# Patient Record
Sex: Female | Born: 1980 | Race: Black or African American | Hispanic: No | Marital: Single | Smoking: Never smoker
Health system: Southern US, Community
[De-identification: ages and names within clinical notes are randomized; demographics above are authoritative.]

## PROBLEM LIST (undated history)

## (undated) DIAGNOSIS — G43909 Migraine, unspecified, not intractable, without status migrainosus: Secondary | ICD-10-CM

## (undated) DIAGNOSIS — E079 Disorder of thyroid, unspecified: Secondary | ICD-10-CM

## (undated) DIAGNOSIS — E018 Other iodine-deficiency related thyroid disorders and allied conditions: Secondary | ICD-10-CM

## (undated) DIAGNOSIS — Z9289 Personal history of other medical treatment: Secondary | ICD-10-CM

## (undated) DIAGNOSIS — D649 Anemia, unspecified: Secondary | ICD-10-CM

## (undated) DIAGNOSIS — D219 Benign neoplasm of connective and other soft tissue, unspecified: Secondary | ICD-10-CM

## (undated) HISTORY — PX: KNEE SURGERY: SHX244

---

## 2003-04-28 ENCOUNTER — Emergency Department (HOSPITAL_COMMUNITY): Admission: EM | Admit: 2003-04-28 | Discharge: 2003-04-28 | Payer: Self-pay | Admitting: Emergency Medicine

## 2003-04-28 ENCOUNTER — Encounter: Payer: Self-pay | Admitting: Emergency Medicine

## 2003-05-12 ENCOUNTER — Encounter: Payer: Self-pay | Admitting: Internal Medicine

## 2003-05-12 ENCOUNTER — Encounter: Admission: RE | Admit: 2003-05-12 | Discharge: 2003-05-12 | Payer: Self-pay | Admitting: Internal Medicine

## 2003-06-30 ENCOUNTER — Emergency Department (HOSPITAL_COMMUNITY): Admission: EM | Admit: 2003-06-30 | Discharge: 2003-06-30 | Payer: Self-pay | Admitting: Emergency Medicine

## 2003-08-28 ENCOUNTER — Emergency Department (HOSPITAL_COMMUNITY): Admission: AD | Admit: 2003-08-28 | Discharge: 2003-08-28 | Payer: Self-pay | Admitting: Family Medicine

## 2003-09-02 ENCOUNTER — Emergency Department (HOSPITAL_COMMUNITY): Admission: AD | Admit: 2003-09-02 | Discharge: 2003-09-02 | Payer: Self-pay | Admitting: Family Medicine

## 2004-11-26 ENCOUNTER — Emergency Department (HOSPITAL_COMMUNITY): Admission: AD | Admit: 2004-11-26 | Discharge: 2004-11-26 | Payer: Self-pay | Admitting: Family Medicine

## 2007-12-05 ENCOUNTER — Emergency Department (HOSPITAL_COMMUNITY): Admission: EM | Admit: 2007-12-05 | Discharge: 2007-12-05 | Payer: Self-pay | Admitting: Family Medicine

## 2007-12-22 ENCOUNTER — Emergency Department (HOSPITAL_COMMUNITY): Admission: EM | Admit: 2007-12-22 | Discharge: 2007-12-22 | Payer: Self-pay | Admitting: Family Medicine

## 2009-06-18 ENCOUNTER — Emergency Department (HOSPITAL_COMMUNITY): Admission: EM | Admit: 2009-06-18 | Discharge: 2009-06-18 | Payer: Self-pay | Admitting: Emergency Medicine

## 2010-09-26 ENCOUNTER — Encounter (HOSPITAL_COMMUNITY)
Admission: RE | Admit: 2010-09-26 | Discharge: 2010-10-14 | Payer: Self-pay | Source: Home / Self Care | Attending: Endocrinology | Admitting: Endocrinology

## 2010-10-14 ENCOUNTER — Ambulatory Visit (HOSPITAL_COMMUNITY)
Admission: RE | Admit: 2010-10-14 | Discharge: 2010-10-14 | Payer: Self-pay | Source: Home / Self Care | Attending: Endocrinology | Admitting: Endocrinology

## 2010-11-06 ENCOUNTER — Encounter: Payer: Self-pay | Admitting: Obstetrics & Gynecology

## 2010-11-06 ENCOUNTER — Encounter: Payer: Self-pay | Admitting: Endocrinology

## 2010-12-26 LAB — HCG, SERUM, QUALITATIVE: Preg, Serum: NEGATIVE

## 2011-01-20 LAB — URINALYSIS, ROUTINE W REFLEX MICROSCOPIC

## 2011-01-20 LAB — URINE MICROSCOPIC-ADD ON

## 2011-07-10 LAB — POCT URINALYSIS DIP (DEVICE)
Bilirubin Urine: NEGATIVE
Nitrite: NEGATIVE
Operator id: 247071
Specific Gravity, Urine: 1.015

## 2011-07-10 LAB — POCT PREGNANCY, URINE: Operator id: 247071

## 2013-04-15 HISTORY — PX: MYOMECTOMY: SHX85

## 2014-05-14 ENCOUNTER — Encounter (HOSPITAL_COMMUNITY): Payer: Self-pay | Admitting: Emergency Medicine

## 2014-05-14 ENCOUNTER — Emergency Department (HOSPITAL_COMMUNITY)
Admission: EM | Admit: 2014-05-14 | Discharge: 2014-05-14 | Disposition: A | Discharge status: Home / Self Care | Payer: BC Managed Care – PPO | Attending: Emergency Medicine | Admitting: Emergency Medicine

## 2014-05-14 DIAGNOSIS — N898 Other specified noninflammatory disorders of vagina: Secondary | ICD-10-CM | POA: Insufficient documentation

## 2014-05-14 DIAGNOSIS — E079 Disorder of thyroid, unspecified: Secondary | ICD-10-CM | POA: Insufficient documentation

## 2014-05-14 DIAGNOSIS — M545 Low back pain, unspecified: Secondary | ICD-10-CM | POA: Insufficient documentation

## 2014-05-14 DIAGNOSIS — Z3202 Encounter for pregnancy test, result negative: Secondary | ICD-10-CM | POA: Insufficient documentation

## 2014-05-14 DIAGNOSIS — Z791 Long term (current) use of non-steroidal anti-inflammatories (NSAID): Secondary | ICD-10-CM | POA: Insufficient documentation

## 2014-05-14 DIAGNOSIS — Z79899 Other long term (current) drug therapy: Secondary | ICD-10-CM | POA: Insufficient documentation

## 2014-05-14 DIAGNOSIS — R112 Nausea with vomiting, unspecified: Secondary | ICD-10-CM | POA: Insufficient documentation

## 2014-05-14 HISTORY — DX: Benign neoplasm of connective and other soft tissue, unspecified: D21.9

## 2014-05-14 HISTORY — DX: Disorder of thyroid, unspecified: E07.9

## 2014-05-14 LAB — LIPASE, BLOOD: LIPASE: 22 U/L (ref 11–59)

## 2014-05-14 LAB — CBC WITH DIFFERENTIAL/PLATELET
BASOS ABS: 0 10*3/uL (ref 0.0–0.1)
Basophils Relative: 0 % (ref 0–1)
EOS ABS: 0 10*3/uL (ref 0.0–0.7)
EOS PCT: 1 % (ref 0–5)
HEMATOCRIT: 31.8 % — AB (ref 36.0–46.0)
HEMOGLOBIN: 10 g/dL — AB (ref 12.0–15.0)
LYMPHS ABS: 2.3 10*3/uL (ref 0.7–4.0)
Lymphocytes Relative: 39 % (ref 12–46)
MCH: 22.7 pg — ABNORMAL LOW (ref 26.0–34.0)
MCHC: 31.4 g/dL (ref 30.0–36.0)
MCV: 72.3 fL — ABNORMAL LOW (ref 78.0–100.0)
MONO ABS: 0.3 10*3/uL (ref 0.1–1.0)
MONOS PCT: 6 % (ref 3–12)
NEUTROS ABS: 3.2 10*3/uL (ref 1.7–7.7)
Neutrophils Relative %: 54 % (ref 43–77)
PLATELETS: 277 10*3/uL (ref 150–400)
RBC: 4.4 MIL/uL (ref 3.87–5.11)
RDW: 18.3 % — ABNORMAL HIGH (ref 11.5–15.5)
WBC: 5.8 10*3/uL (ref 4.0–10.5)

## 2014-05-14 LAB — COMPREHENSIVE METABOLIC PANEL
ALT: 40 U/L — ABNORMAL HIGH (ref 0–35)
ANION GAP: 15 (ref 5–15)
AST: 40 U/L — ABNORMAL HIGH (ref 0–37)
Albumin: 3.5 g/dL (ref 3.5–5.2)
Alkaline Phosphatase: 87 U/L (ref 39–117)
BUN: 8 mg/dL (ref 6–23)
CHLORIDE: 102 meq/L (ref 96–112)
CO2: 24 meq/L (ref 19–32)
Calcium: 8.8 mg/dL (ref 8.4–10.5)
Creatinine, Ser: 0.84 mg/dL (ref 0.50–1.10)
Glucose, Bld: 74 mg/dL (ref 70–99)
POTASSIUM: 3.4 meq/L — AB (ref 3.7–5.3)
Sodium: 141 mEq/L (ref 137–147)
TOTAL PROTEIN: 7.7 g/dL (ref 6.0–8.3)
Total Bilirubin: 0.3 mg/dL (ref 0.3–1.2)

## 2014-05-14 LAB — URINALYSIS, ROUTINE W REFLEX MICROSCOPIC
BILIRUBIN URINE: NEGATIVE
Glucose, UA: NEGATIVE mg/dL
KETONES UR: 40 mg/dL — AB
NITRITE: POSITIVE — AB
PH: 5.5 (ref 5.0–8.0)
Protein, ur: NEGATIVE mg/dL
SPECIFIC GRAVITY, URINE: 1.016 (ref 1.005–1.030)
Urobilinogen, UA: 1 mg/dL (ref 0.0–1.0)

## 2014-05-14 LAB — URINE MICROSCOPIC-ADD ON

## 2014-05-14 LAB — PREGNANCY, URINE: PREG TEST UR: NEGATIVE

## 2014-05-14 MED ORDER — PANTOPRAZOLE SODIUM 40 MG PO TBEC: 40.0000 mg | DELAYED_RELEASE_TABLET | Freq: Every day | ORAL | Status: DC

## 2014-05-14 NOTE — Discharge Instructions (Signed)
Please follow up with your primary care doctor if your symptoms do not resolve. You can discuss with them about doing an ultrasound of your gallbladder as outpatient. You can try taking the protonix to see if it helps you.

## 2014-05-14 NOTE — ED Provider Notes (Signed)
I saw and evaluated the patient, reviewed the resident's note and I agree with the findings and plan.  No abdominal ttp on exam.  Could be having some biliary colic.  Labs are reassuring.  She is on her menses.  I suspect the urine is contaminated.   Follow up with pcp for outpatient ultrasound.  Dorie Rank, MD 05/14/14 2023

## 2014-05-14 NOTE — ED Provider Notes (Signed)
CSN: [id number]     Arrival date & time 05/14/14  1459 History   First MD Initiated Contact with Patient 05/14/14 1804     Chief Complaint  Patient presents with  . Back Pain     (Consider location/radiation/quality/duration/timing/severity/associated sxs/prior Treatment) HPI  33 yo female with hx of hyperthyroidism and fibroids here with back pain. Yesterday after eating lunch she had bilateral back pain on the mid-lower back that radiated towards the spine. It lasted 30 mins, was sharp and throbbing, 10/10 and it resolved spontaenously.  She had emesis with it. Didn't eat anything since then. She had similar episode again yesterday night around 3 AM with emesis. She used to have these symptoms before more frequently but it got better after she had fibroid surgery. She had her last episode on September before yesterday's episode. Her mother has similar symptoms and she was found to have gallbladder problems. Patient is currently on her period. For last few weeks she has foul smelling urine with cloudy appearance. No burning or pain with urination.  Currently has very mild pain on the back. Denies abdominal pain, diarrhea, constipation, hematochezia, chest pain, sob, headache, fever, numbness, tingling. Denies alcohol use. Denies any previous hx of pancreas or gallbladder problems.  Past Medical History  Diagnosis Date  . Fibroids   . Thyroid disease    History reviewed. No pertinent past surgical history. History reviewed. No pertinent family history. History  Substance Use Topics  . Smoking status: Never Smoker   . Smokeless tobacco: Not on file  . Alcohol Use: No   OB History   Grav Para Term Preterm Abortions TAB SAB Ect Mult Living                 Review of Systems  Constitutional: Negative for fever, chills, activity change, appetite change and fatigue.  HENT: Negative for congestion, ear pain, facial swelling, hearing loss, rhinorrhea, sore throat and tinnitus.   Eyes:  Negative for photophobia, pain, discharge, redness, itching and visual disturbance.  Respiratory: Negative for apnea, cough, choking, chest tightness, shortness of breath, wheezing and stridor.   Cardiovascular: Negative for chest pain, palpitations and leg swelling.  Gastrointestinal: Positive for nausea and vomiting. Negative for abdominal pain, diarrhea, constipation, blood in stool, abdominal distention, anal bleeding and rectal pain.  Genitourinary: Positive for vaginal bleeding. Negative for dysuria, urgency, frequency, hematuria, flank pain, decreased urine volume, vaginal discharge, difficulty urinating, genital sores, vaginal pain, menstrual problem, pelvic pain and dyspareunia.  Musculoskeletal: Positive for back pain. Negative for arthralgias, gait problem, joint swelling, myalgias, neck pain and neck stiffness.  Skin: Negative.   Allergic/Immunologic: Negative.   Neurological: Negative.   Hematological: Negative.   Psychiatric/Behavioral: Negative.       Allergies  Review of patient's allergies indicates no known allergies.  Home Medications   Prior to Admission medications   Medication Sig Start Date End Date Taking? Authorizing Provider  ibuprofen (ADVIL,MOTRIN) 800 MG tablet Take 800 mg by mouth every 8 (eight) hours as needed for moderate pain.   Yes Historical Provider, MD  levothyroxine (SYNTHROID, LEVOTHROID) 75 MCG tablet Take 75 mcg by mouth daily before breakfast.   Yes Historical Provider, MD  pantoprazole (PROTONIX) 40 MG tablet Take 1 tablet (40 mg total) by mouth daily. 05/14/14   Lakeyn Dokken, MD   BP 112/69  Pulse 88  Temp(Src) 98.2 F (36.8 C) (Oral)  Resp 13  SpO2 100%  LMP 05/12/2014 Physical Exam  Constitutional: She is oriented to person,  place, and time. She appears well-developed and well-nourished. No distress.  HENT:  Head: Normocephalic and atraumatic.  Right Ear: External ear normal.  Left Ear: External ear normal.  Nose: Nose normal.   Mouth/Throat: Oropharynx is clear and moist.  Eyes: Conjunctivae and EOM are normal. Pupils are equal, round, and reactive to light. Right eye exhibits no discharge. Left eye exhibits no discharge. No scleral icterus.  Neck: Normal range of motion and full passive range of motion without pain. Neck supple. No JVD present. No spinous process tenderness and no muscular tenderness present. No rigidity. No edema, no erythema and normal range of motion present. No thyromegaly present.  Cardiovascular: Normal rate, regular rhythm, S1 normal, S2 normal, normal heart sounds and intact distal pulses.  Exam reveals no gallop and no friction rub.   No murmur heard. Pulmonary/Chest: Effort normal and breath sounds normal. No respiratory distress. She has no wheezes. She has no rales. She exhibits no tenderness.  Abdominal: Soft. Bowel sounds are normal. She exhibits no distension and no mass. There is no tenderness. There is no rigidity, no rebound, no guarding, no CVA tenderness and negative Murphy's sign.  No tenderness anywhere other than menstrual cramps.  Musculoskeletal: Normal range of motion. She exhibits no edema and no tenderness.  No tenderness on the back with palpation. Spine non tender.  Lymphadenopathy:    She has no cervical adenopathy.  Neurological: She is alert and oriented to person, place, and time. She has normal strength and normal reflexes. No cranial nerve deficit or sensory deficit. GCS eye subscore is 4. GCS verbal subscore is 5. GCS motor subscore is 6.  Skin: No rash noted. She is not diaphoretic. No erythema. No pallor.  Psychiatric: She has a normal mood and affect. Her behavior is normal.    ED Course  Procedures (including critical care time) Labs Review Labs Reviewed  CBC WITH DIFFERENTIAL - Abnormal; Notable for the following:    Hemoglobin 10.0 (*)    HCT 31.8 (*)    MCV 72.3 (*)    MCH 22.7 (*)    RDW 18.3 (*)    All other components within normal limits   COMPREHENSIVE METABOLIC PANEL - Abnormal; Notable for the following:    Potassium 3.4 (*)    AST 40 (*)    ALT 40 (*)    All other components within normal limits  LIPASE, BLOOD  PREGNANCY, URINE  URINALYSIS, ROUTINE W REFLEX MICROSCOPIC    Imaging Review No results found.   EKG Interpretation None      Will get CBC, CMP, and UA.   CBC shows low Hgb 10.0. No baseline found. CMP normal. Lipase normal.    MDM   Final diagnoses:  Bilateral low back pain without sciatica   CBC and CMP looks normal except for anemia. She admits to having anemia diagnosed previously and has taken iron for it. She currently has no pain. Will have her follow up with her PCP if pain recurs. Suggested doing u/s of abdomen as outpatient to rule out biliary etiology of pain. Her symptom is unusual for gallbladder pain since she has no abdominal complaints.   UA is unclear since she in on her period. It's nitrite positive and TNTC RBC with bacteria. This might be contaminant from her vaginal bleed. Will not treat for UTI.  Will give her some PPI's in case it's atypical GERD related pain.   Dellia Nims, MD 05/14/14 2019

## 2014-05-14 NOTE — ED Notes (Addendum)
Presents with mid-lower back cramping associated with vomiting after eating-pain began after eating lunch yesterday. Nausea and vominting only after eating. Pain is intermittent. Eating and drinking makes pain worse. Throwing up makes pain better. Denies abdominal pain, denies diarrhea.

## 2014-05-20 ENCOUNTER — Observation Stay (HOSPITAL_COMMUNITY)
Admission: EM | Admit: 2014-05-20 | Discharge: 2014-05-21 | Disposition: A | Discharge status: Home / Self Care | Payer: BC Managed Care – PPO | Attending: General Surgery | Admitting: General Surgery

## 2014-05-20 ENCOUNTER — Observation Stay (HOSPITAL_COMMUNITY): Payer: BC Managed Care – PPO | Admitting: Anesthesiology

## 2014-05-20 ENCOUNTER — Encounter (HOSPITAL_COMMUNITY)
Admission: EM | Disposition: A | Discharge status: Home / Self Care | Payer: Self-pay | Source: Home / Self Care | Attending: Emergency Medicine

## 2014-05-20 ENCOUNTER — Encounter (HOSPITAL_COMMUNITY): Payer: Self-pay | Admitting: Emergency Medicine

## 2014-05-20 ENCOUNTER — Observation Stay (HOSPITAL_COMMUNITY): Payer: BC Managed Care – PPO

## 2014-05-20 ENCOUNTER — Emergency Department (HOSPITAL_COMMUNITY): Payer: BC Managed Care – PPO

## 2014-05-20 ENCOUNTER — Encounter (HOSPITAL_COMMUNITY): Payer: BC Managed Care – PPO | Admitting: Anesthesiology

## 2014-05-20 DIAGNOSIS — R7402 Elevation of levels of lactic acid dehydrogenase (LDH): Secondary | ICD-10-CM

## 2014-05-20 DIAGNOSIS — E890 Postprocedural hypothyroidism: Secondary | ICD-10-CM | POA: Insufficient documentation

## 2014-05-20 DIAGNOSIS — K8000 Calculus of gallbladder with acute cholecystitis without obstruction: Secondary | ICD-10-CM | POA: Diagnosis not present

## 2014-05-20 DIAGNOSIS — K8050 Calculus of bile duct without cholangitis or cholecystitis without obstruction: Secondary | ICD-10-CM

## 2014-05-20 DIAGNOSIS — R740 Nonspecific elevation of levels of transaminase and lactic acid dehydrogenase [LDH]: Secondary | ICD-10-CM

## 2014-05-20 DIAGNOSIS — R300 Dysuria: Secondary | ICD-10-CM

## 2014-05-20 DIAGNOSIS — K8010 Calculus of gallbladder with chronic cholecystitis without obstruction: Secondary | ICD-10-CM

## 2014-05-20 DIAGNOSIS — K812 Acute cholecystitis with chronic cholecystitis: Secondary | ICD-10-CM | POA: Diagnosis present

## 2014-05-20 DIAGNOSIS — K8020 Calculus of gallbladder without cholecystitis without obstruction: Secondary | ICD-10-CM

## 2014-05-20 DIAGNOSIS — R7401 Elevation of levels of liver transaminase levels: Secondary | ICD-10-CM

## 2014-05-20 DIAGNOSIS — E039 Hypothyroidism, unspecified: Secondary | ICD-10-CM | POA: Diagnosis present

## 2014-05-20 DIAGNOSIS — Z5309 Procedure and treatment not carried out because of other contraindication: Secondary | ICD-10-CM | POA: Insufficient documentation

## 2014-05-20 HISTORY — DX: Migraine, unspecified, not intractable, without status migrainosus: G43.909

## 2014-05-20 HISTORY — PX: CHOLECYSTECTOMY: SHX55

## 2014-05-20 HISTORY — DX: Other iodine-deficiency related thyroid disorders and allied conditions: E01.8

## 2014-05-20 HISTORY — DX: Personal history of other medical treatment: Z92.89

## 2014-05-20 HISTORY — DX: Anemia, unspecified: D64.9

## 2014-05-20 HISTORY — PX: LAPAROSCOPIC CHOLECYSTECTOMY: SUR755

## 2014-05-20 LAB — CBC WITH DIFFERENTIAL/PLATELET
BASOS ABS: 0 10*3/uL (ref 0.0–0.1)
BASOS PCT: 0 % (ref 0–1)
EOS ABS: 0 10*3/uL (ref 0.0–0.7)
Eosinophils Relative: 1 % (ref 0–5)
HCT: 32.1 % — ABNORMAL LOW (ref 36.0–46.0)
HEMOGLOBIN: 10.3 g/dL — AB (ref 12.0–15.0)
Lymphocytes Relative: 41 % (ref 12–46)
Lymphs Abs: 1.6 10*3/uL (ref 0.7–4.0)
MCH: 22.9 pg — AB (ref 26.0–34.0)
MCHC: 32.1 g/dL (ref 30.0–36.0)
MCV: 71.5 fL — ABNORMAL LOW (ref 78.0–100.0)
MONO ABS: 0.2 10*3/uL (ref 0.1–1.0)
MONOS PCT: 5 % (ref 3–12)
NEUTROS PCT: 53 % (ref 43–77)
Neutro Abs: 2.2 10*3/uL (ref 1.7–7.7)
PLATELETS: 289 10*3/uL (ref 150–400)
RBC: 4.49 MIL/uL (ref 3.87–5.11)
RDW: 18.3 % — ABNORMAL HIGH (ref 11.5–15.5)
WBC: 4 10*3/uL (ref 4.0–10.5)

## 2014-05-20 LAB — URINALYSIS, ROUTINE W REFLEX MICROSCOPIC
Glucose, UA: NEGATIVE mg/dL
Hgb urine dipstick: NEGATIVE
Ketones, ur: 15 mg/dL — AB
Nitrite: NEGATIVE
Protein, ur: 30 mg/dL — AB
Specific Gravity, Urine: 1.02 (ref 1.005–1.030)
Urobilinogen, UA: 1 mg/dL (ref 0.0–1.0)
pH: 7.5 (ref 5.0–8.0)

## 2014-05-20 LAB — URINE MICROSCOPIC-ADD ON

## 2014-05-20 LAB — COMPREHENSIVE METABOLIC PANEL
ALT: 496 U/L — AB (ref 0–35)
AST: 933 U/L — AB (ref 0–37)
Albumin: 3.5 g/dL (ref 3.5–5.2)
Alkaline Phosphatase: 208 U/L — ABNORMAL HIGH (ref 39–117)
Anion gap: 14 (ref 5–15)
BILIRUBIN TOTAL: 1.1 mg/dL (ref 0.3–1.2)
BUN: 6 mg/dL (ref 6–23)
CO2: 23 mEq/L (ref 19–32)
Calcium: 8.8 mg/dL (ref 8.4–10.5)
Chloride: 104 mEq/L (ref 96–112)
Creatinine, Ser: 0.88 mg/dL (ref 0.50–1.10)
GFR, EST NON AFRICAN AMERICAN: 86 mL/min — AB (ref >90)
Glucose, Bld: 107 mg/dL — ABNORMAL HIGH (ref 70–99)
POTASSIUM: 3.6 meq/L — AB (ref 3.7–5.3)
SODIUM: 141 meq/L (ref 137–147)
Total Protein: 7.7 g/dL (ref 6.0–8.3)

## 2014-05-20 LAB — LIPASE, BLOOD: Lipase: 29 U/L (ref 11–59)

## 2014-05-20 SURGERY — LAPAROSCOPIC CHOLECYSTECTOMY WITH INTRAOPERATIVE CHOLANGIOGRAM
Anesthesia: General

## 2014-05-20 MED ORDER — KCL IN DEXTROSE-NACL 20-5-0.9 MEQ/L-%-% IV SOLN
INTRAVENOUS | Status: DC
  Administered 2014-05-20 – 2014-05-21 (×2): via INTRAVENOUS
  Filled 2014-05-20 (×6): qty 1000

## 2014-05-20 MED ORDER — MIDAZOLAM HCL 5 MG/5ML IJ SOLN
INTRAMUSCULAR | Status: DC | PRN
  Administered 2014-05-20 (×2): 1 mg via INTRAVENOUS

## 2014-05-20 MED ORDER — PROPOFOL 10 MG/ML IV BOLUS
INTRAVENOUS | Status: DC | PRN
  Administered 2014-05-20: 120 mg via INTRAVENOUS

## 2014-05-20 MED ORDER — OXYCODONE HCL 5 MG PO TABS: 5.0000 mg | ORAL_TABLET | Freq: Once | ORAL | Status: DC | PRN

## 2014-05-20 MED ORDER — DEXTROSE-NACL 5-0.9 % IV SOLN: INTRAVENOUS | Status: DC

## 2014-05-20 MED ORDER — SODIUM CHLORIDE 0.9 % IV SOLN
INTRAVENOUS | Status: DC | PRN
  Administered 2014-05-20: 14:00:00

## 2014-05-20 MED ORDER — ROCURONIUM BROMIDE 50 MG/5ML IV SOLN
INTRAVENOUS | Status: AC
  Filled 2014-05-20: qty 1

## 2014-05-20 MED ORDER — MEPERIDINE HCL 25 MG/ML IJ SOLN: 6.2500 mg | INTRAMUSCULAR | Status: DC | PRN

## 2014-05-20 MED ORDER — LACTATED RINGERS IV SOLN
INTRAVENOUS | Status: DC
  Administered 2014-05-20: 12:00:00 via INTRAVENOUS

## 2014-05-20 MED ORDER — PROPOFOL 10 MG/ML IV BOLUS
INTRAVENOUS | Status: AC
  Filled 2014-05-20: qty 20

## 2014-05-20 MED ORDER — PROMETHAZINE HCL 25 MG/ML IJ SOLN
25.0000 mg | Freq: Once | INTRAMUSCULAR | Status: AC
  Administered 2014-05-20: 25 mg via INTRAVENOUS
  Filled 2014-05-20: qty 1

## 2014-05-20 MED ORDER — CIPROFLOXACIN IN D5W 400 MG/200ML IV SOLN
400.0000 mg | Freq: Two times a day (BID) | INTRAVENOUS | Status: AC
  Administered 2014-05-20: 400 mg via INTRAVENOUS
  Filled 2014-05-20: qty 200

## 2014-05-20 MED ORDER — LIDOCAINE HCL (CARDIAC) 20 MG/ML IV SOLN
INTRAVENOUS | Status: DC | PRN
  Administered 2014-05-20: 100 mg via INTRAVENOUS

## 2014-05-20 MED ORDER — HYDROMORPHONE HCL PF 1 MG/ML IJ SOLN
0.2500 mg | INTRAMUSCULAR | Status: DC | PRN
Start: 2014-05-20 — End: 2014-05-20

## 2014-05-20 MED ORDER — ONDANSETRON HCL 4 MG/2ML IJ SOLN
INTRAMUSCULAR | Status: DC | PRN
  Administered 2014-05-20: 4 mg via INTRAVENOUS

## 2014-05-20 MED ORDER — LIDOCAINE HCL (CARDIAC) 20 MG/ML IV SOLN
INTRAVENOUS | Status: AC
  Filled 2014-05-20: qty 5

## 2014-05-20 MED ORDER — MORPHINE SULFATE 2 MG/ML IJ SOLN: 2.0000 mg | INTRAMUSCULAR | Status: DC | PRN

## 2014-05-20 MED ORDER — ENOXAPARIN SODIUM 40 MG/0.4ML ~~LOC~~ SOLN
40.0000 mg | SUBCUTANEOUS | Status: DC
  Administered 2014-05-21: 40 mg via SUBCUTANEOUS
  Filled 2014-05-20 (×2): qty 0.4

## 2014-05-20 MED ORDER — LACTATED RINGERS IV SOLN
INTRAVENOUS | Status: DC | PRN
  Administered 2014-05-20 (×2): via INTRAVENOUS

## 2014-05-20 MED ORDER — FENTANYL CITRATE 0.05 MG/ML IJ SOLN
100.0000 ug | Freq: Once | INTRAMUSCULAR | Status: AC
  Administered 2014-05-20: 100 ug via INTRAVENOUS
  Filled 2014-05-20: qty 2

## 2014-05-20 MED ORDER — NEOSTIGMINE METHYLSULFATE 10 MG/10ML IV SOLN
INTRAVENOUS | Status: DC | PRN
  Administered 2014-05-20: 4 mg via INTRAVENOUS

## 2014-05-20 MED ORDER — CIPROFLOXACIN IN D5W 400 MG/200ML IV SOLN
400.0000 mg | INTRAVENOUS | Status: AC
  Administered 2014-05-20: 400 mg via INTRAVENOUS
  Filled 2014-05-20: qty 200

## 2014-05-20 MED ORDER — IBUPROFEN 800 MG PO TABS: 800.0000 mg | ORAL_TABLET | Freq: Three times a day (TID) | ORAL | Status: DC | PRN

## 2014-05-20 MED ORDER — ACETAMINOPHEN 325 MG PO TABS: 650.0000 mg | ORAL_TABLET | Freq: Four times a day (QID) | ORAL | Status: DC | PRN

## 2014-05-20 MED ORDER — DEXAMETHASONE SODIUM PHOSPHATE 4 MG/ML IJ SOLN
INTRAMUSCULAR | Status: DC | PRN
  Administered 2014-05-20: 4 mg via INTRAVENOUS

## 2014-05-20 MED ORDER — MORPHINE SULFATE 4 MG/ML IJ SOLN
4.0000 mg | Freq: Once | INTRAMUSCULAR | Status: AC
  Administered 2014-05-20: 4 mg via INTRAVENOUS
  Filled 2014-05-20: qty 1

## 2014-05-20 MED ORDER — ACETAMINOPHEN 650 MG RE SUPP: 650.0000 mg | Freq: Four times a day (QID) | RECTAL | Status: DC | PRN

## 2014-05-20 MED ORDER — FENTANYL CITRATE 0.05 MG/ML IJ SOLN
INTRAMUSCULAR | Status: DC | PRN
  Administered 2014-05-20 (×3): 50 ug via INTRAVENOUS
  Administered 2014-05-20: 100 ug via INTRAVENOUS

## 2014-05-20 MED ORDER — OXYCODONE HCL 5 MG/5ML PO SOLN: 5.0000 mg | Freq: Once | ORAL | Status: DC | PRN

## 2014-05-20 MED ORDER — ROCURONIUM BROMIDE 100 MG/10ML IV SOLN
INTRAVENOUS | Status: DC | PRN
  Administered 2014-05-20: 40 mg via INTRAVENOUS
  Administered 2014-05-20: 10 mg via INTRAVENOUS

## 2014-05-20 MED ORDER — BUPIVACAINE-EPINEPHRINE (PF) 0.25% -1:200000 IJ SOLN
INTRAMUSCULAR | Status: AC
  Filled 2014-05-20: qty 30

## 2014-05-20 MED ORDER — FENTANYL CITRATE 0.05 MG/ML IJ SOLN
INTRAMUSCULAR | Status: AC
  Filled 2014-05-20: qty 5

## 2014-05-20 MED ORDER — ONDANSETRON HCL 4 MG/2ML IJ SOLN: 4.0000 mg | Freq: Once | INTRAMUSCULAR | Status: DC | PRN

## 2014-05-20 MED ORDER — SODIUM CHLORIDE 0.9 % IR SOLN
Status: DC | PRN
  Administered 2014-05-20: 1000 mL

## 2014-05-20 MED ORDER — ENOXAPARIN SODIUM 40 MG/0.4ML ~~LOC~~ SOLN: 40.0000 mg | SUBCUTANEOUS | Status: DC

## 2014-05-20 MED ORDER — MORPHINE SULFATE 2 MG/ML IJ SOLN
2.0000 mg | INTRAMUSCULAR | Status: DC | PRN
  Administered 2014-05-20: 4 mg via INTRAVENOUS
  Administered 2014-05-20: 2 mg via INTRAVENOUS
  Filled 2014-05-20: qty 2
  Filled 2014-05-20: qty 1

## 2014-05-20 MED ORDER — BUPIVACAINE-EPINEPHRINE 0.25% -1:200000 IJ SOLN
INTRAMUSCULAR | Status: DC | PRN
  Administered 2014-05-20: 13 mL

## 2014-05-20 MED ORDER — NEOSTIGMINE METHYLSULFATE 10 MG/10ML IV SOLN
INTRAVENOUS | Status: AC
  Filled 2014-05-20: qty 1

## 2014-05-20 MED ORDER — LEVOTHYROXINE SODIUM 75 MCG PO TABS
75.0000 ug | ORAL_TABLET | Freq: Every day | ORAL | Status: DC
  Administered 2014-05-21: 75 ug via ORAL
  Filled 2014-05-20 (×2): qty 1

## 2014-05-20 MED ORDER — MIDAZOLAM HCL 2 MG/2ML IJ SOLN
INTRAMUSCULAR | Status: AC
  Filled 2014-05-20: qty 2

## 2014-05-20 MED ORDER — ONDANSETRON HCL 4 MG/2ML IJ SOLN
4.0000 mg | Freq: Once | INTRAMUSCULAR | Status: AC
  Administered 2014-05-20: 4 mg via INTRAVENOUS
  Filled 2014-05-20: qty 2

## 2014-05-20 MED ORDER — ONDANSETRON HCL 4 MG/2ML IJ SOLN
4.0000 mg | Freq: Once | INTRAMUSCULAR | Status: AC
  Administered 2014-05-20: 4 mg via INTRAVENOUS

## 2014-05-20 MED ORDER — 0.9 % SODIUM CHLORIDE (POUR BTL) OPTIME
TOPICAL | Status: DC | PRN
  Administered 2014-05-20: 1000 mL

## 2014-05-20 MED ORDER — GLYCOPYRROLATE 0.2 MG/ML IJ SOLN
INTRAMUSCULAR | Status: DC | PRN
  Administered 2014-05-20: 0.6 mg via INTRAVENOUS

## 2014-05-20 MED ORDER — GLYCOPYRROLATE 0.2 MG/ML IJ SOLN
INTRAMUSCULAR | Status: AC
  Filled 2014-05-20: qty 3

## 2014-05-20 MED ORDER — DEXAMETHASONE SODIUM PHOSPHATE 4 MG/ML IJ SOLN
INTRAMUSCULAR | Status: AC
  Filled 2014-05-20: qty 1

## 2014-05-20 MED ORDER — ONDANSETRON HCL 4 MG/2ML IJ SOLN: 4.0000 mg | Freq: Four times a day (QID) | INTRAMUSCULAR | Status: DC | PRN

## 2014-05-20 MED ORDER — HYDROCODONE-ACETAMINOPHEN 5-325 MG PO TABS
1.0000 | ORAL_TABLET | ORAL | Status: DC | PRN
  Administered 2014-05-21: 1 via ORAL
  Administered 2014-05-21: 2 via ORAL
  Filled 2014-05-20: qty 2
  Filled 2014-05-20: qty 1

## 2014-05-20 SURGICAL SUPPLY — 46 items
APPLIER CLIP 5 13 M/L LIGAMAX5 (MISCELLANEOUS) ×2
APPLIER CLIP ROT 10 11.4 M/L (STAPLE) ×2
BANDAGE ADH SHEER 1  50/CT (GAUZE/BANDAGES/DRESSINGS) ×6 IMPLANT
BENZOIN TINCTURE PRP APPL 2/3 (GAUZE/BANDAGES/DRESSINGS) ×2 IMPLANT
BLADE SURG ROTATE 9660 (MISCELLANEOUS) IMPLANT
CANISTER SUCTION 2500CC (MISCELLANEOUS) ×2 IMPLANT
CHLORAPREP W/TINT 26ML (MISCELLANEOUS) ×2 IMPLANT
CLIP APPLIE 5 13 M/L LIGAMAX5 (MISCELLANEOUS) ×1 IMPLANT
CLIP APPLIE ROT 10 11.4 M/L (STAPLE) ×1 IMPLANT
COVER MAYO STAND STRL (DRAPES) ×2 IMPLANT
COVER SURGICAL LIGHT HANDLE (MISCELLANEOUS) ×2 IMPLANT
DECANTER SPIKE VIAL GLASS SM (MISCELLANEOUS) ×2 IMPLANT
DRAPE C-ARM 42X72 X-RAY (DRAPES) ×2 IMPLANT
DRAPE UTILITY 15X26 W/TAPE STR (DRAPE) ×4 IMPLANT
DRSG TEGADERM 4X4.75 (GAUZE/BANDAGES/DRESSINGS) ×2 IMPLANT
ELECT REM PT RETURN 9FT ADLT (ELECTROSURGICAL) ×2
ELECTRODE REM PT RTRN 9FT ADLT (ELECTROSURGICAL) ×1 IMPLANT
ENDOLOOP SUT PDS II  0 18 (SUTURE) ×1
ENDOLOOP SUT PDS II 0 18 (SUTURE) ×1 IMPLANT
GAUZE SPONGE 2X2 8PLY STRL LF (GAUZE/BANDAGES/DRESSINGS) ×1 IMPLANT
GLOVE BIOGEL M STRL SZ7.5 (GLOVE) ×2 IMPLANT
GLOVE BIOGEL PI IND STRL 8 (GLOVE) ×1 IMPLANT
GLOVE BIOGEL PI INDICATOR 8 (GLOVE) ×1
GOWN STRL REUS W/ TWL LRG LVL3 (GOWN DISPOSABLE) ×3 IMPLANT
GOWN STRL REUS W/ TWL XL LVL3 (GOWN DISPOSABLE) ×1 IMPLANT
GOWN STRL REUS W/TWL LRG LVL3 (GOWN DISPOSABLE) ×3
GOWN STRL REUS W/TWL XL LVL3 (GOWN DISPOSABLE) ×1
KIT BASIN OR (CUSTOM PROCEDURE TRAY) ×2 IMPLANT
KIT ROOM TURNOVER OR (KITS) ×2 IMPLANT
NS IRRIG 1000ML POUR BTL (IV SOLUTION) ×2 IMPLANT
PAD ARMBOARD 7.5X6 YLW CONV (MISCELLANEOUS) ×2 IMPLANT
POUCH SPECIMEN RETRIEVAL 10MM (ENDOMECHANICALS) ×2 IMPLANT
SCISSORS LAP 5X35 DISP (ENDOMECHANICALS) ×2 IMPLANT
SET CHOLANGIOGRAPH 5 50 .035 (SET/KITS/TRAYS/PACK) ×2 IMPLANT
SET IRRIG TUBING LAPAROSCOPIC (IRRIGATION / IRRIGATOR) ×2 IMPLANT
SLEEVE ENDOPATH XCEL 5M (ENDOMECHANICALS) ×4 IMPLANT
SPECIMEN JAR SMALL (MISCELLANEOUS) ×2 IMPLANT
SPONGE GAUZE 2X2 STER 10/PKG (GAUZE/BANDAGES/DRESSINGS) ×1
STRIP CLOSURE SKIN 1/2X4 (GAUZE/BANDAGES/DRESSINGS) ×2 IMPLANT
SUT MNCRL AB 4-0 PS2 18 (SUTURE) ×2 IMPLANT
TOWEL OR 17X24 6PK STRL BLUE (TOWEL DISPOSABLE) ×2 IMPLANT
TOWEL OR 17X26 10 PK STRL BLUE (TOWEL DISPOSABLE) ×2 IMPLANT
TRAY LAPAROSCOPIC (CUSTOM PROCEDURE TRAY) ×2 IMPLANT
TROCAR XCEL 12X100 BLDLESS (ENDOMECHANICALS) ×2 IMPLANT
TROCAR XCEL BLUNT TIP 100MML (ENDOMECHANICALS) ×2 IMPLANT
TROCAR XCEL NON-BLD 5MMX100MML (ENDOMECHANICALS) ×2 IMPLANT

## 2014-05-20 NOTE — Brief Op Note (Signed)
05/20/2014  2:47 PM  PATIENT:  [name]  34 y.o. female  PRE-OPERATIVE DIAGNOSIS:  SYMPTOMATIC CHOLELITHIASIS  POST-OPERATIVE DIAGNOSIS:  Acute on chronic cholecystitis with calculous   PROCEDURE:  Procedure(s): LAPAROSCOPIC CHOLECYSTECTOMY WITH ATTEMPTED INTRAOPERATIVE CHOLANGIOGRAM (N/A)  SURGEON:  Surgeon(s) and Role:    * Gayland Curry, MD - Primary    * Merrie Roof, MD - Assisting  PHYSICIAN ASSISTANT: NONE   ASSISTANTS: Dr Marlou Starks   ANESTHESIA:   general  EBL:  Total I/O In: 1000 [I.V.:1000] Out: -   BLOOD ADMINISTERED:none  DRAINS: none   LOCAL MEDICATIONS USED:  MARCAINE     SPECIMEN:  Source of Specimen:  gallbladder  DISPOSITION OF SPECIMEN:  PATHOLOGY  COUNTS:  YES  TOURNIQUET:  * No tourniquets in log *  DICTATION: .Other Dictation: Dictation Number [id number]  PLAN OF CARE: Admit to inpatient   PATIENT DISPOSITION:  PACU - hemodynamically stable.   Delay start of Pharmacological VTE agent (>24hrs) due to surgical blood loss or risk of bleeding: no  Leighton Ruff. Redmond Pulling, MD, FACS General, Bariatric, & Minimally Invasive Surgery [facility], [place]

## 2014-05-20 NOTE — H&P (Signed)
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  Duration of symptoms is 1 week although she reports symptoms about 2x per year after meals since  2010.  Time pattern is intermittent.  Moderate in severity.  The symptoms start after she eats.  Associated with nausea, vomiting and diarrhea.  Yesterday, she ate subway and developed pain once again.  Her symptoms persisted overnight.  She was given morphine and fentanyl in the ED and her pain is now 2-3 out of 10.  She denies modifying factors at home.  No alleviating factors.  Last oral intake was 3AM this morning.  She denies fever, chills or sweats.  Denies melena or hematochezia.  Denies a cardiac history and otherwise healthy.  Her AST/ALT have increased to 496/933, bilirubin is normal.  Normal white count.  [place] of abdomen positive for stones, normal CBD, no gallbladder wall thickening or pericholecystic fluid.    Past Medical History  Diagnosis Date  . Fibroids   . Thyroid disease   . Iodine hypothyroidism     Past Surgical History  Procedure Laterality Date  . Myomectomy  july 2014    Family History  Problem Relation Age of Onset  . Lupus Mother   . Diabetes Father    Social History:  reports that she has never smoked. She does not have any smokeless tobacco history on file. She reports that she does not drink alcohol or use illicit drugs.  Allergies: No Known Allergies   (Not in a hospital admission)  Results for orders placed during the hospital encounter of 05/20/14 (from the past 48 hour(s))  CBC WITH DIFFERENTIAL     Status: Abnormal   Collection Time    05/20/14  5:37 AM      Result Value Ref Range   WBC 4.0  4.0 - 10.5 K/uL   RBC 4.49  3.87 - 5.11 MIL/uL   Hemoglobin 10.3 (*) 12.0 - 15.0 g/dL   HCT 32.1 (*) 36.0 - 46.0 %   MCV 71.5 (*) 78.0 - 100.0 fL   MCH 22.9 (*) 26.0 - 34.0 pg   MCHC 32.1  30.0 - 36.0 g/dL   RDW 18.3 (*) 11.5 - 15.5 %   Platelets 289  150 - 400 K/uL   Neutrophils Relative % 53  43 - 77 %   Neutro Abs 2.2  1.7 - 7.7 K/uL   Lymphocytes Relative 41  12 - 46 %   Lymphs Abs 1.6  0.7 - 4.0 K/uL   Monocytes Relative 5  3 - 12 %   Monocytes Absolute 0.2  0.1 - 1.0 K/uL   Eosinophils Relative 1  0 - 5 %   Eosinophils Absolute 0.0  0.0 - 0.7 K/uL   Basophils Relative 0  0 - 1 %   Basophils Absolute 0.0  0.0 - 0.1 K/uL  COMPREHENSIVE METABOLIC PANEL     Status: Abnormal   Collection Time    05/20/14  5:37 AM      Result Value Ref Range   Sodium 141  137 - 147 mEq/L   Potassium 3.6 (*) 3.7 - 5.3 mEq/L   Chloride 104  96 - 112 mEq/L   CO2 23  19 - 32 mEq/L   Glucose, Bld 107 (*) 70 - 99 mg/dL  BUN 6  6 - 23 mg/dL   Creatinine, Ser 0.88  0.50 - 1.10 mg/dL   Calcium 8.8  8.4 - 10.5 mg/dL   Total Protein 7.7  6.0 - 8.3 g/dL   Albumin 3.5  3.5 - 5.2 g/dL   AST 933 (*) 0 - 37 U/L   ALT 496 (*) 0 - 35 U/L   Alkaline Phosphatase 208 (*) 39 - 117 U/L   Total Bilirubin 1.1  0.3 - 1.2 mg/dL   GFR calc non Af Amer 86 (*) >90 mL/min   GFR calc Af Amer >90  >90 mL/min   Comment: (NOTE)     The eGFR has been calculated using the CKD EPI equation.     This calculation has not been validated in all clinical situations.     eGFR's persistently <90 mL/min signify possible Chronic Kidney     Disease.   Anion gap 14  5 - 15  LIPASE, BLOOD     Status: None   Collection Time    05/20/14  5:37 AM      Result Value Ref Range   Lipase 29  11 - 59 U/L  URINALYSIS, ROUTINE W REFLEX MICROSCOPIC     Status: Abnormal   Collection Time    05/20/14  5:45 AM      Result Value Ref Range   Color, Urine AMBER (*) YELLOW   Comment: BIOCHEMICALS MAY BE AFFECTED BY COLOR   APPearance CLOUDY (*) CLEAR   Specific Gravity, Urine 1.020  1.005 - 1.030   pH 7.5  5.0 - 8.0   Glucose, UA  NEGATIVE  NEGATIVE mg/dL   Hgb urine dipstick NEGATIVE  NEGATIVE   Bilirubin Urine SMALL (*) NEGATIVE   Ketones, ur 15 (*) NEGATIVE mg/dL   Protein, ur 30 (*) NEGATIVE mg/dL   Urobilinogen, UA 1.0  0.0 - 1.0 mg/dL   Nitrite NEGATIVE  NEGATIVE   Leukocytes, UA SMALL (*) NEGATIVE  URINE MICROSCOPIC-ADD ON     Status: Abnormal   Collection Time    05/20/14  5:45 AM      Result Value Ref Range   Squamous Epithelial / LPF FEW (*) RARE   WBC, UA 3-6  <3 WBC/hpf   RBC / HPF 0-2  <3 RBC/hpf   Bacteria, UA FEW (*) RARE   Urine-Other MUCOUS PRESENT     US Abdomen Complete  05/20/2014   CLINICAL DATA:  Abdominal pain  EXAM: ULTRASOUND ABDOMEN COMPLETE  COMPARISON:  None.  FINDINGS: Gallbladder:  Within the gallbladder, there are multiple echogenic foci which move and shadow consistent with gallstones. Largest individual gallstone measures 1.0 cm in length. There is no gallbladder wall thickening or pericholecystic fluid. No sonographic Murphy sign noted.  Common bile duct:  Diameter: 5 mm. There is no intrahepatic, common hepatic, or common bile duct dilatation.  Liver:  No focal lesion identified. Within normal limits in parenchymal echogenicity.  IVC:  No abnormality visualized.  Pancreas:  Visualized portion unremarkable. Portions of the pancreas are obscured by gas, primarily in the tail region.  Spleen:  Size and appearance within normal limits.  Right Kidney:  Length: 10.9 cm. Echogenicity within normal limits. No mass or hydronephrosis visualized.  Left Kidney:  Length: 11.2 cm. Echogenicity within normal limits. No mass or hydronephrosis visualized.  Abdominal aorta:  No aneurysm visualized.  Other findings:  No demonstrable ascites.  IMPRESSION: Cholelithiasis. Portions of the pancreas are obscured by gas. Visualized portions of pancreas appear normal. Study otherwise  unremarkable.   Electronically Signed   By: Lowella Grip M.D.   On: 05/20/2014 07:37    Review of Systems  Constitutional:  Negative.   HENT: Negative.   Eyes: Negative.   Respiratory: Negative.   Cardiovascular: Negative.   Gastrointestinal: Positive for nausea, vomiting, abdominal pain and diarrhea. Negative for heartburn, blood in stool and melena.  Genitourinary: Positive for dysuria, frequency and flank pain. Negative for urgency and hematuria.  Musculoskeletal: Negative.   Neurological: Negative.   Endo/Heme/Allergies: Negative.   Psychiatric/Behavioral: Negative.     Blood pressure 133/76, pulse 55, temperature 97.9 F (36.6 C), temperature source Oral, resp. rate 16, height _0  (1.702 m), weight 240 lb (108.863 kg), last menstrual period 05/12/2014, SpO2 100.00%. Physical Exam  Constitutional: She is oriented to person, place, and time. She appears well-developed and well-nourished. No distress.  HENT:  Head: Normocephalic.  Neck: Neck supple. No thyromegaly present.  Cardiovascular: Normal rate, regular rhythm, normal heart sounds and intact distal pulses.  Exam reveals no gallop and no friction rub.   No murmur heard. Respiratory: Effort normal and breath sounds normal. No respiratory distress. She has no wheezes. She has no rales. She exhibits no tenderness.  GI: Soft. Bowel sounds are normal. She exhibits no distension and no mass. There is no rebound and no guarding.  No CVA tenderness.  Negative Murphy's sign.  She exhibits minimal tenderness to RUQ  Musculoskeletal: Normal range of motion. She exhibits no tenderness.  Lymphadenopathy:    She has no cervical adenopathy.  Neurological: She is alert and oriented to person, place, and time.  Skin: Skin is dry. No rash noted. She is not diaphoretic. No erythema. No pallor.  Psychiatric: She has a normal mood and affect. Her behavior is normal. Judgment and thought content normal.     Assessment/Plan Symptomatic cholelithiasis Transaminitis, possibly passed a CBD stone -keep NPO -proceed with a laparoscopic cholecystectomy with IOC later  today -obtain consent -Cipro on call to OR -IVF -pain control -repeat LFTs in AM Dysuria -negative nitrates, few WBCs, obtain urine culture and follow Hypothyroidism C/w home meds  Kyion Gautier, [facility] ANP-BC Pager [phone] 05/20/2014, 11:24 AM

## 2014-05-20 NOTE — ED Notes (Signed)
[place] at bedside per [place]

## 2014-05-20 NOTE — ED Notes (Signed)
ciproflaxocin and dextrose 5% and 0.9% nacl with KCl 20 mEq/L infusion not available before patient's departure to OR

## 2014-05-20 NOTE — ED Notes (Signed)
Dr Miller at bedside. 

## 2014-05-20 NOTE — Anesthesia Preprocedure Evaluation (Addendum)
Anesthesia Evaluation  Patient identified by MRN, date of birth, ID band Patient awake    Reviewed: Allergy & Precautions, H&P , NPO status , Patient's Chart, lab work & pertinent test results  Airway Mallampati: I TM Distance: >3 FB Neck ROM: Full    Dental   Pulmonary          Cardiovascular     Neuro/Psych    GI/Hepatic   Endo/Other  Hypothyroidism   Renal/GU      Musculoskeletal   Abdominal   Peds  Hematology   Anesthesia Other Findings   Reproductive/Obstetrics                          Anesthesia Physical Anesthesia Plan  ASA: II  Anesthesia Plan: General   Post-op Pain Management:    Induction: Intravenous  Airway Management Planned: Oral ETT  Additional Equipment:   Intra-op Plan:   Post-operative Plan: Extubation in OR  Informed Consent: I have reviewed the patients History and Physical, chart, labs and discussed the procedure including the risks, benefits and alternatives for the proposed anesthesia with the patient or authorized representative who has indicated his/her understanding and acceptance.   Dental advisory given  Plan Discussed with: Surgeon and CRNA  Anesthesia Plan Comments:        Anesthesia Quick Evaluation

## 2014-05-20 NOTE — ED Notes (Signed)
Obtained procedural consent

## 2014-05-20 NOTE — Anesthesia Procedure Notes (Signed)
Procedure Name: Intubation Date/Time: 05/20/2014 1:10 PM Performed by: Susa Loffler Pre-anesthesia Checklist: Patient identified, Timeout performed, Emergency Drugs available, Suction available and Patient being monitored Patient Re-evaluated:Patient Re-evaluated prior to inductionOxygen Delivery Method: Circle system utilized Preoxygenation: Pre-oxygenation with 100% oxygen Intubation Type: IV induction Ventilation: Mask ventilation without difficulty Laryngoscope Size: Mac and 3 Grade View: Grade II Tube type: Oral Tube size: 7.5 mm Number of attempts: 1 Airway Equipment and Method: Stylet Placement Confirmation: ETT inserted through vocal cords under direct vision,  positive ETCO2 and breath sounds checked- equal and bilateral Secured at: 21 cm Tube secured with: Tape Dental Injury: Teeth and Oropharynx as per pre-operative assessment

## 2014-05-20 NOTE — ED Notes (Signed)
Pt to ED c/o upper abdominal cramping since Sunday. Also c/o back pain; recently seen in ED on 7/30 for same

## 2014-05-20 NOTE — Transfer of Care (Signed)
Immediate Anesthesia Transfer of Care Note  Patient: [name]  Procedure(s) Performed: Procedure(s): LAPAROSCOPIC CHOLECYSTECTOMY WITH INTRAOPERATIVE CHOLANGIOGRAM (N/A)  Patient Location: PACU  Anesthesia Type:General  Level of Consciousness: awake, oriented and patient cooperative  Airway & Oxygen Therapy: Patient Spontanous Breathing and Patient connected to nasal cannula oxygen  Post-op Assessment: Report given to PACU RN and Post -op Vital signs reviewed and stable  Post vital signs: Reviewed  Complications: No apparent anesthesia complications

## 2014-05-20 NOTE — ED Provider Notes (Signed)
9:54 AM Care assumed from Dr. Sabra Heck.  Pt feels better now.  US shows gallstones.  Surgery consult pending.   Taken to OR.  Clinical Impression: 1. Biliary colic   2. Calculus of gallbladder without cholecystitis without obstruction       Marissa Siren III, MD 05/20/14 1520

## 2014-05-20 NOTE — Anesthesia Postprocedure Evaluation (Signed)
Anesthesia Post Note  Patient: [name]  Procedure(s) Performed: Procedure(s) (LRB): LAPAROSCOPIC CHOLECYSTECTOMY WITH INTRAOPERATIVE CHOLANGIOGRAM (N/A)  Anesthesia type: general  Patient location: PACU  Post pain: Pain level controlled  Post assessment: Patient's Cardiovascular Status Stable  Last Vitals:  Filed Vitals:   05/20/14 1445  BP: 106/66  Pulse: 58  Temp: 36.5 C  Resp: 12    Post vital signs: Reviewed and stable  Level of consciousness: sedated  Complications: No apparent anesthesia complications

## 2014-05-20 NOTE — ED Provider Notes (Signed)
CSN: [id number]     Arrival date & time 05/20/14  0522 History   First MD Initiated Contact with Patient 05/20/14 [phone]     Chief Complaint  Patient presents with  . Back Pain  . Abdominal Pain     (Consider location/radiation/quality/duration/timing/severity/associated sxs/prior Treatment) HPI Comments: 33 year old female overweight, history of fibroids status post resection.   Complains of abdominal pain in the epigastrium, right and left upper quadrant radiating to the back and associated with multiple episodes of vomiting. She has had very little to eat and she states that this can make the pain worse sometimes. She denies hematuria or diarrhea but has had some dysuria for several months. There has been no fevers or chills. The symptoms are persistent over the last 5 days though they wax and wane throughout the day and worsened by eating. She still has her gallbladder, still has appendix. Denies history of pancreatitis or stomach ulcer and does not use aspirin-containing medications. Occasionally uses ibuprofen.   Patient is a 33 y.o. female presenting with back pain and abdominal pain. The history is provided by the patient.  Back Pain Associated symptoms: abdominal pain   Abdominal Pain   Past Medical History  Diagnosis Date  . Fibroids   . Thyroid disease   . Iodine hypothyroidism    Past Surgical History  Procedure Laterality Date  . Myomectomy  july 2014   Family History  Problem Relation Age of Onset  . Lupus Mother   . Diabetes Father    History  Substance Use Topics  . Smoking status: Never Smoker   . Smokeless tobacco: Not on file  . Alcohol Use: No   OB History   Grav Para Term Preterm Abortions TAB SAB Ect Mult Living                 Review of Systems  Gastrointestinal: Positive for abdominal pain.  Musculoskeletal: Positive for back pain.  All other systems reviewed and are negative.     Allergies  Review of patient's allergies indicates no known  allergies.  Home Medications   Prior to Admission medications   Medication Sig Start Date End Date Taking? Authorizing Provider  ibuprofen (ADVIL,MOTRIN) 800 MG tablet Take 800 mg by mouth every 8 (eight) hours as needed for moderate pain.   Yes Historical Provider, MD  levothyroxine (SYNTHROID, LEVOTHROID) 75 MCG tablet Take 75 mcg by mouth daily before breakfast.   Yes Historical Provider, MD  pantoprazole (PROTONIX) 40 MG tablet Take 1 tablet (40 mg total) by mouth daily. 05/14/14   Tasrif Ahmed, MD   BP 130/77  Pulse 71  Temp(Src) 98 F (36.7 C) (Oral)  Resp 16  Ht 5\' 7"  (1.702 m)  Wt 240 lb (108.863 kg)  BMI 37.58 kg/m2  SpO2 100%  LMP 05/12/2014 Physical Exam  Nursing note and vitals reviewed. Constitutional: She appears well-developed and well-nourished. No distress.  HENT:  Head: Normocephalic and atraumatic.  Mouth/Throat: Oropharynx is clear and moist. No oropharyngeal exudate.  Eyes: Conjunctivae and EOM are normal. Pupils are equal, round, and reactive to light. Right eye exhibits no discharge. Left eye exhibits no discharge. No scleral icterus.  Neck: Normal range of motion. Neck supple. No JVD present. No thyromegaly present.  Cardiovascular: Normal rate, regular rhythm, normal heart sounds and intact distal pulses.  Exam reveals no gallop and no friction rub.   No murmur heard. Pulmonary/Chest: Effort normal and breath sounds normal. No respiratory distress. She has no wheezes. She has  no rales.  Abdominal: Soft. Bowel sounds are normal. She exhibits no distension and no mass. There is tenderness ( Mild tenderness to palpation in the epigastrium, right upper and left upper quadrant. No Murphy's sign, no lower abdominal tenderness, very soft).  Musculoskeletal: Normal range of motion. She exhibits no edema and no tenderness.  Lymphadenopathy:    She has no cervical adenopathy.  Neurological: She is alert. Coordination normal.  Skin: Skin is warm and dry. No rash  noted. No erythema.  Psychiatric: She has a normal mood and affect. Her behavior is normal.    ED Course  Procedures (including critical care time) Labs Review Labs Reviewed  CBC WITH DIFFERENTIAL - Abnormal; Notable for the following:    Hemoglobin 10.3 (*)    HCT 32.1 (*)    MCV 71.5 (*)    MCH 22.9 (*)    RDW 18.3 (*)    All other components within normal limits  COMPREHENSIVE METABOLIC PANEL - Abnormal; Notable for the following:    Potassium 3.6 (*)    Glucose, Bld 107 (*)    AST 933 (*)    ALT 496 (*)    Alkaline Phosphatase 208 (*)    GFR calc non Af Amer 86 (*)    All other components within normal limits  URINALYSIS, ROUTINE W REFLEX MICROSCOPIC - Abnormal; Notable for the following:    Color, Urine AMBER (*)    APPearance CLOUDY (*)    Bilirubin Urine SMALL (*)    Ketones, ur 15 (*)    Protein, ur 30 (*)    Leukocytes, UA SMALL (*)    All other components within normal limits  URINE MICROSCOPIC-ADD ON - Abnormal; Notable for the following:    Squamous Epithelial / LPF FEW (*)    Bacteria, UA FEW (*)    All other components within normal limits  URINE CULTURE  LIPASE, BLOOD  HEPATIC FUNCTION PANEL  BASIC METABOLIC PANEL  CBC    Imaging Review Dg Cholangiogram Operative  05/20/2014   CLINICAL DATA:  Cholelithiasis.  EXAM: INTRAOPERATIVE CHOLANGIOGRAM  TECHNIQUE: Cholangiographic images from the C-arm fluoroscopic device were submitted for interpretation post-operatively. Please see the procedural report for the amount of contrast and the fluoroscopy time utilized.  COMPARISON:  Ultrasound 05/20/2014  FINDINGS: There is no contrast in the biliary system. The contrast was extravasating and accumulating along the liver margin.  IMPRESSION: Non visualization of the biliary system.   Electronically Signed   By: Markus Daft M.D.   On: 05/20/2014 14:55   US Abdomen Complete  05/20/2014   CLINICAL DATA:  Abdominal pain  EXAM: ULTRASOUND ABDOMEN COMPLETE  COMPARISON:  None.   FINDINGS: Gallbladder:  Within the gallbladder, there are multiple echogenic foci which move and shadow consistent with gallstones. Largest individual gallstone measures 1.0 cm in length. There is no gallbladder wall thickening or pericholecystic fluid. No sonographic Murphy sign noted.  Common bile duct:  Diameter: 5 mm. There is no intrahepatic, common hepatic, or common bile duct dilatation.  Liver:  No focal lesion identified. Within normal limits in parenchymal echogenicity.  IVC:  No abnormality visualized.  Pancreas:  Visualized portion unremarkable. Portions of the pancreas are obscured by gas, primarily in the tail region.  Spleen:  Size and appearance within normal limits.  Right Kidney:  Length: 10.9 cm. Echogenicity within normal limits. No mass or hydronephrosis visualized.  Left Kidney:  Length: 11.2 cm. Echogenicity within normal limits. No mass or hydronephrosis visualized.  Abdominal aorta:  No aneurysm visualized.  Other findings:  No demonstrable ascites.  IMPRESSION: Cholelithiasis. Portions of the pancreas are obscured by gas. Visualized portions of pancreas appear normal. Study otherwise unremarkable.   Electronically Signed   By: Lowella Grip M.D.   On: 05/20/2014 07:37     EKG Interpretation None      MDM   Final diagnoses:  Biliary colic  Calculus of gallbladder without cholecystitis without obstruction    The patient has normal vital signs, there is some concern that this could be cholecystitis, pancreatitis or transaminitis. Would also consider stomach ulcer. Start with labs, symptomatic medications, patient declines pain medication but requests antiemetic at this time.  LFT's up - pt has sx of cholecystitis, [place] read pending, change of shift - care signed out to oncoming EDP  Johnna Acosta, MD 05/20/14 2050

## 2014-05-20 NOTE — ED Notes (Signed)
Pt c/o RUQ pain x 2 days; reports hx of intermittent abd pain x 5 years. Pain described as a "stomach ache" with cramping. Pt reports taking ibuprofen at home without relief.  Denies dysuria; reports strong odor and cloudiness x 1 week.  Also has been told in the past that gallbladder was inflamed

## 2014-05-20 NOTE — H&P (Signed)
I saw the patient, participated in the history, exam and medical decision making, and concur with the physician assistant's note above.  Pt seen and examined. History as above. Postprandial rt abd and shoulder pain. +GS. Believe elevated LFTs are due to passage of gallstone. No etoh. No risk factors for hepatitis. Hasn't been taking tylenol  I believe the patient's symptoms are consistent with gallbladder disease.  We discussed gallbladder disease. The patient was given Neurosurgeon. We discussed non-operative and operative management. We discussed the signs & symptoms of acute cholecystitis  I discussed laparoscopic cholecystectomy with IOC in detail.  The patient was shown diagrams detailing the procedure.  We discussed the risks and benefits of a laparoscopic cholecystectomy including, but not limited to bleeding, infection, injury to surrounding structures such as the intestine or liver, bile leak, retained gallstones, need to convert to an open procedure, prolonged diarrhea, blood clots such as  DVT, common bile duct injury, anesthesia risks, and possible need for additional procedures.  We discussed the typical post-operative recovery course. I explained that the likelihood of improvement of their symptoms is good. Discussed possibility of postop ERCP  Leighton Ruff. Redmond Pulling, MD, FACS General, Bariatric, & Minimally Invasive Surgery [facility], [place]

## 2014-05-20 NOTE — ED Notes (Signed)
Report called to OR. Pt will be going to [facility]

## 2014-05-21 ENCOUNTER — Encounter (HOSPITAL_COMMUNITY): Payer: Self-pay | Admitting: General Surgery

## 2014-05-21 LAB — CBC
HEMATOCRIT: 31.4 % — AB (ref 36.0–46.0)
HEMOGLOBIN: 9.9 g/dL — AB (ref 12.0–15.0)
MCH: 23 pg — ABNORMAL LOW (ref 26.0–34.0)
MCHC: 31.5 g/dL (ref 30.0–36.0)
MCV: 72.9 fL — ABNORMAL LOW (ref 78.0–100.0)
Platelets: 263 10*3/uL (ref 150–400)
RBC: 4.31 MIL/uL (ref 3.87–5.11)
RDW: 18.8 % — ABNORMAL HIGH (ref 11.5–15.5)
WBC: 6.3 10*3/uL (ref 4.0–10.5)

## 2014-05-21 LAB — HEPATIC FUNCTION PANEL
ALBUMIN: 2.9 g/dL — AB (ref 3.5–5.2)
ALBUMIN: 2.9 g/dL — AB (ref 3.5–5.2)
ALK PHOS: 193 U/L — AB (ref 39–117)
ALK PHOS: 199 U/L — AB (ref 39–117)
ALT: 598 U/L — AB (ref 0–35)
ALT: 576 U/L — ABNORMAL HIGH (ref 0–35)
AST: 556 U/L — ABNORMAL HIGH (ref 0–37)
AST: 429 U/L — AB (ref 0–37)
BILIRUBIN TOTAL: 1.3 mg/dL — AB (ref 0.3–1.2)
Bilirubin, Direct: 0.5 mg/dL — ABNORMAL HIGH (ref 0.0–0.3)
Bilirubin, Direct: 0.9 mg/dL — ABNORMAL HIGH (ref 0.0–0.3)
Indirect Bilirubin: 0.5 mg/dL (ref 0.3–0.9)
Indirect Bilirubin: 0.4 mg/dL (ref 0.3–0.9)
Total Bilirubin: 1 mg/dL (ref 0.3–1.2)
Total Protein: 6.7 g/dL (ref 6.0–8.3)
Total Protein: 6.8 g/dL (ref 6.0–8.3)

## 2014-05-21 LAB — BASIC METABOLIC PANEL
Anion gap: 11 (ref 5–15)
BUN: 3 mg/dL — ABNORMAL LOW (ref 6–23)
CALCIUM: 8.2 mg/dL — AB (ref 8.4–10.5)
CHLORIDE: 107 meq/L (ref 96–112)
CO2: 23 meq/L (ref 19–32)
CREATININE: 0.79 mg/dL (ref 0.50–1.10)
GFR calc Af Amer: >90 mL/min (ref >90)
GFR calc non Af Amer: >90 mL/min (ref >90)
Glucose, Bld: 132 mg/dL — ABNORMAL HIGH (ref 70–99)
Potassium: 4.5 mEq/L (ref 3.7–5.3)
Sodium: 141 mEq/L (ref 137–147)

## 2014-05-21 MED ORDER — SODIUM CHLORIDE 0.9 % IV SOLN: 250.0000 mL | INTRAVENOUS | Status: DC | PRN

## 2014-05-21 MED ORDER — ACETAMINOPHEN 325 MG PO TABS: 650.0000 mg | ORAL_TABLET | Freq: Four times a day (QID) | ORAL | Status: DC | PRN

## 2014-05-21 MED ORDER — SODIUM CHLORIDE 0.9 % IJ SOLN: 3.0000 mL | Freq: Two times a day (BID) | INTRAMUSCULAR | Status: DC

## 2014-05-21 MED ORDER — HYDROCODONE-ACETAMINOPHEN 5-325 MG PO TABS: 1.0000 | ORAL_TABLET | Freq: Four times a day (QID) | ORAL | Status: DC | PRN

## 2014-05-21 MED ORDER — IBUPROFEN 800 MG PO TABS
800.0000 mg | ORAL_TABLET | Freq: Three times a day (TID) | ORAL | Status: DC
  Administered 2014-05-21: 800 mg via ORAL
  Filled 2014-05-21: qty 1

## 2014-05-21 MED ORDER — IBUPROFEN 800 MG PO TABS: 800.0000 mg | ORAL_TABLET | Freq: Three times a day (TID) | ORAL | Status: DC | PRN

## 2014-05-21 MED ORDER — SODIUM CHLORIDE 0.9 % IJ SOLN: 3.0000 mL | INTRAMUSCULAR | Status: DC | PRN

## 2014-05-21 NOTE — Progress Notes (Signed)
Patient ID: [name], female   DOB: 05-10-1981, 33 y.o.   MRN: [id number]     [place]      [address] [place], [place] [address]    Phone: [phone] FAX: [phone]     Subjective: No n/v.  Sore, not much pain.  Voiding.  VSS.  Afebrile.  No n/v.    Objective:  Vital signs:  Filed Vitals:   05/20/14 1550 05/20/14 2044 05/21/14 0214 05/21/14 0708  BP: 123/74 130/77 114/71 129/76  Pulse: 61 71 62 78  Temp: 98.3 F (36.8 C) 98 F (36.7 C) 97.9 F (36.6 C) 97.8 F (36.6 C)  TempSrc: Oral Oral Oral Oral  Resp: 14 16 16 16   Height:      Weight:      SpO2: 98% 100% 99% 100%    Last BM Date: 05/19/14  Intake/Output   Yesterday:  08/05 0701 - 08/06 0700 In: 2712.9 [P.O.:240; I.V.:2472.9] Out: 1200 [Urine:1200] This shift:  Total I/O In: -  Out: 650 [Urine:650]  Physical Exam: General: Pt awake/alert/oriented x4 in no acute distress Chest: CTA.  No chest wall pain w good excursion CV:  Pulses intact.  Regular rhythm MS: Normal AROM mjr joints.  No obvious deformity Abdomen: Soft.  Nondistended.  Mildly tender at incisions only.  No evidence of peritonitis.  No incarcerated hernias. Ext:  SCDs BLE.  No mjr edema.  No cyanosis Skin: No petechiae / purpura   Problem List:   Active Problems:   Symptomatic cholelithiasis   Dysuria   Hypothyroidism (acquired)    Results:   Labs: Results for orders placed during the hospital encounter of 05/20/14 (from the past 48 hour(s))  CBC WITH DIFFERENTIAL     Status: Abnormal   Collection Time    05/20/14  5:37 AM      Result Value Ref Range   WBC 4.0  4.0 - 10.5 K/uL   RBC 4.49  3.87 - 5.11 MIL/uL   Hemoglobin 10.3 (*) 12.0 - 15.0 g/dL   HCT 32.1 (*) 36.0 - 46.0 %   MCV 71.5 (*) 78.0 - 100.0 fL   MCH 22.9 (*) 26.0 - 34.0 pg   MCHC 32.1  30.0 - 36.0 g/dL   RDW 18.3 (*) 11.5 - 15.5 %   Platelets 289  150 - 400 K/uL   Neutrophils Relative % 53  43 - 77  %   Neutro Abs 2.2  1.7 - 7.7 K/uL   Lymphocytes Relative 41  12 - 46 %   Lymphs Abs 1.6  0.7 - 4.0 K/uL   Monocytes Relative 5  3 - 12 %   Monocytes Absolute 0.2  0.1 - 1.0 K/uL   Eosinophils Relative 1  0 - 5 %   Eosinophils Absolute 0.0  0.0 - 0.7 K/uL   Basophils Relative 0  0 - 1 %   Basophils Absolute 0.0  0.0 - 0.1 K/uL  COMPREHENSIVE METABOLIC PANEL     Status: Abnormal   Collection Time    05/20/14  5:37 AM      Result Value Ref Range   Sodium 141  137 - 147 mEq/L   Potassium 3.6 (*) 3.7 - 5.3 mEq/L   Chloride 104  96 - 112 mEq/L   CO2 23  19 - 32 mEq/L   Glucose, Bld 107 (*) 70 - 99 mg/dL   BUN 6  6 - 23 mg/dL   Creatinine, Ser 0.88  0.50 - 1.10 mg/dL   Calcium 8.8  8.4 - 10.5 mg/dL   Total Protein 7.7  6.0 - 8.3 g/dL   Albumin 3.5  3.5 - 5.2 g/dL   AST 933 (*) 0 - 37 U/L   ALT 496 (*) 0 - 35 U/L   Alkaline Phosphatase 208 (*) 39 - 117 U/L   Total Bilirubin 1.1  0.3 - 1.2 mg/dL   GFR calc non Af Amer 86 (*) >90 mL/min   GFR calc Af Amer >90  >90 mL/min   Comment: (NOTE)     The eGFR has been calculated using the CKD EPI equation.     This calculation has not been validated in all clinical situations.     eGFR's persistently <90 mL/min signify possible Chronic Kidney     Disease.   Anion gap 14  5 - 15  LIPASE, BLOOD     Status: None   Collection Time    05/20/14  5:37 AM      Result Value Ref Range   Lipase 29  11 - 59 U/L  URINALYSIS, ROUTINE W REFLEX MICROSCOPIC     Status: Abnormal   Collection Time    05/20/14  5:45 AM      Result Value Ref Range   Color, Urine AMBER (*) YELLOW   Comment: BIOCHEMICALS MAY BE AFFECTED BY COLOR   APPearance CLOUDY (*) CLEAR   Specific Gravity, Urine 1.020  1.005 - 1.030   pH 7.5  5.0 - 8.0   Glucose, UA NEGATIVE  NEGATIVE mg/dL   Hgb urine dipstick NEGATIVE  NEGATIVE   Bilirubin Urine SMALL (*) NEGATIVE   Ketones, ur 15 (*) NEGATIVE mg/dL   Protein, ur 30 (*) NEGATIVE mg/dL   Urobilinogen, UA 1.0  0.0 - 1.0 mg/dL    Nitrite NEGATIVE  NEGATIVE   Leukocytes, UA SMALL (*) NEGATIVE  URINE MICROSCOPIC-ADD ON     Status: Abnormal   Collection Time    05/20/14  5:45 AM      Result Value Ref Range   Squamous Epithelial / LPF FEW (*) RARE   WBC, UA 3-6  <3 WBC/hpf   RBC / HPF 0-2  <3 RBC/hpf   Bacteria, UA FEW (*) RARE   Urine-Other MUCOUS PRESENT    CBC     Status: Abnormal   Collection Time    05/21/14  5:53 AM      Result Value Ref Range   WBC 6.3  4.0 - 10.5 K/uL   RBC 4.31  3.87 - 5.11 MIL/uL   Hemoglobin 9.9 (*) 12.0 - 15.0 g/dL   HCT 31.4 (*) 36.0 - 46.0 %   MCV 72.9 (*) 78.0 - 100.0 fL   MCH 23.0 (*) 26.0 - 34.0 pg   MCHC 31.5  30.0 - 36.0 g/dL   RDW 18.8 (*) 11.5 - 15.5 %   Platelets 263  150 - 400 K/uL    Imaging / Studies: Dg Cholangiogram Operative  05/20/2014   CLINICAL DATA:  Cholelithiasis.  EXAM: INTRAOPERATIVE CHOLANGIOGRAM  TECHNIQUE: Cholangiographic images from the C-arm fluoroscopic device were submitted for interpretation post-operatively. Please see the procedural report for the amount of contrast and the fluoroscopy time utilized.  COMPARISON:  Ultrasound 05/20/2014  FINDINGS: There is no contrast in the biliary system. The contrast was extravasating and accumulating along the liver margin.  IMPRESSION: Non visualization of the biliary system.   Electronically Signed   By: Markus Daft M.D.   On: 05/20/2014 14:55  US Abdomen Complete  05/20/2014   CLINICAL DATA:  Abdominal pain  EXAM: ULTRASOUND ABDOMEN COMPLETE  COMPARISON:  None.  FINDINGS: Gallbladder:  Within the gallbladder, there are multiple echogenic foci which move and shadow consistent with gallstones. Largest individual gallstone measures 1.0 cm in length. There is no gallbladder wall thickening or pericholecystic fluid. No sonographic Murphy sign noted.  Common bile duct:  Diameter: 5 mm. There is no intrahepatic, common hepatic, or common bile duct dilatation.  Liver:  No focal lesion identified. Within normal limits in  parenchymal echogenicity.  IVC:  No abnormality visualized.  Pancreas:  Visualized portion unremarkable. Portions of the pancreas are obscured by gas, primarily in the tail region.  Spleen:  Size and appearance within normal limits.  Right Kidney:  Length: 10.9 cm. Echogenicity within normal limits. No mass or hydronephrosis visualized.  Left Kidney:  Length: 11.2 cm. Echogenicity within normal limits. No mass or hydronephrosis visualized.  Abdominal aorta:  No aneurysm visualized.  Other findings:  No demonstrable ascites.  IMPRESSION: Cholelithiasis. Portions of the pancreas are obscured by gas. Visualized portions of pancreas appear normal. Study otherwise unremarkable.   Electronically Signed   By: Lowella Grip M.D.   On: 05/20/2014 07:37    Medications / Allergies:  Scheduled Meds: . enoxaparin (LOVENOX) injection  40 mg Subcutaneous Q24H  . ibuprofen  800 mg Oral TID  . levothyroxine  75 mcg Oral QAC breakfast   Continuous Infusions: . dextrose 5 % and 0.9 % NaCl with KCl 20 mEq/L 125 mL/hr at 05/21/14 0805   PRN Meds:.acetaminophen, acetaminophen, HYDROcodone-acetaminophen, morphine injection, ondansetron (ZOFRAN) IV  Antibiotics: Anti-infectives   Start     Dose/Rate Route Frequency Ordered Stop   05/20/14 1600  ciprofloxacin (CIPRO) IVPB 400 mg     400 mg 200 mL/hr over 60 Minutes Intravenous Every 12 hours 05/20/14 1547 05/20/14 1911   05/20/14 1130  [MAR Hold]  ciprofloxacin (CIPRO) IVPB 400 mg     (On MAR Hold since 05/20/14 1205)  Comments:  Pharmacy may adjust dosing strength, interval, or rate of medication as needed for optimal therapy for the patient Send with patient on call to the OR.  Anesthesia to complete antibiotic administration <62mn [facility]   400 mg 200 mL/hr over 60 Minutes Intravenous On call to O.R. 05/20/14 1121 05/20/14 1400        Assessment/Plan Acute on chronic cholecystitis with calculous POD#1 lap cholecystectomy  attempted cholangiogram Abnormal LFTs  -await LFTs -SLIV -advance diet -pain control -IS/mobilize -if LFTs are stable/down, discharge home today.  EErby Pian [facility] Pager [phone] 05/21/2014 10:25 AM

## 2014-05-21 NOTE — Anesthesia Postprocedure Evaluation (Signed)
Anesthesia Post Note  Patient: [name]  Procedure(s) Performed: Procedure(s) (LRB): LAPAROSCOPIC CHOLECYSTECTOMY WITH INTRAOPERATIVE CHOLANGIOGRAM (N/A)  Anesthesia type: general  Patient location: PACU  Post pain: Pain level controlled  Post assessment: Patient's Cardiovascular Status Stable  Last Vitals:  Filed Vitals:   05/21/14 0708  BP: 129/76  Pulse: 78  Temp: 36.6 C  Resp: 16    Post vital signs: Reviewed and stable  Level of consciousness: sedated  Complications: No apparent anesthesia complications

## 2014-05-21 NOTE — Discharge Instructions (Signed)

## 2014-05-21 NOTE — Progress Notes (Signed)
Discussed with PA  Leighton Ruff. Redmond Pulling, MD, FACS General, Bariatric, & Minimally Invasive Surgery [facility] Surgery, [place]

## 2014-05-21 NOTE — Op Note (Signed)
[name], [name] NO.:  [id number]  MEDICAL RECORD NO.:  [id number]  LOCATION:  6N04C                        FACILITY:  [facility]  PHYSICIAN:  Leighton Ruff. Redmond Pulling, MD, FACSDATE OF BIRTH:  04/20/1981  DATE OF PROCEDURE:  05/20/2014 DATE OF DISCHARGE:                              OPERATIVE REPORT   PREOPERATIVE DIAGNOSIS:  Symptomatic cholelithiasis.  POSTOPERATIVE DIAGNOSIS:  Acute on chronic cholecystitis with calculous.  PROCEDURE:  Laparoscopic cholecystectomy with attempted cholangiogram.  SURGEON:  Gayland Curry MD.  ASSISTANT SURGEON:  Autumn Messing MD  ANESTHESIA:  General.  ESTIMATED BLOOD LOSS:  Minimal.  INDICATIONS FOR PROCEDURE:  The patient is a 33 year old, obese Afro- American female, who has had multiple episodes over the past years of right-sided and shoulder pain, generally postprandial that lasts for anywhere from 30 minutes to a couple of hours.  She had an attack late last night, early this morning that persisted and prompted her to come to the emergency room.  Her ultrasound showed gallstones without any evidence of significant cholecystitis or common bile duct dilatation. However some of her LFTs, specifically her transaminases were elevated. On exam, she was terribly tender, but this is her 2nd visit to the ER within 1 week for symptomatic cholelithiasis; I recommended admission and cholecystectomy.  We discussed the risks and benefits of gallbladder surgery including but not limited to, bleeding, infection, injury to surrounding structures, need to convert to an open procedure, bile leak, prolonged diarrhea, blood clot formation, post cholecystectomy diarrhea, anesthesia complications, and need for additional procedures, such as ERCP.  We discussed her elevated LFTs probably reflect passage of a gallstone.  We discussed doing intraoperative cholangiograms to delineate any common bile duct anatomy.  We did briefly talk about  the possibility that it may not be technically feasible in that ramifications.  She elected to proceed to surgery.  DESCRIPTION OF PROCEDURE:  She was taken to the OR 1 at [facility] and placed supine on the operating table.  General endotracheal anesthesia was established.  Sequential compression devices were placed. The surgical time-out was performed.  Her abdomen was prepped and draped in the usual standard surgical fashion with ChloraPrep.  She received ciprofloxacin prior to skin incision.  After a surgical time-out, local was infiltrated at the base of her umbilicus.  Next, the incision was made for about 1.5 cm.  The fascia was grasped and lifted anteriorly. Next, the fascia was incised with a #11 blade and the abdominal cavity was entered.  A pursestring suture was placed around the fascial edges with 0 Vicryl and a 12-mm Hasson trocar was placed.  Pneumoperitoneum was smoothly established up to a patient pressure of 15 mmHg without any change in vital signs.  Laparoscope was advanced.  There was no evidence of injury to surrounding structures.  The patient was placed in reverse Trendelenburg and rotated to the left.  Three 5 mm trocars were then placed; one was a xiphoid, two in the right hypochondrium; all under direct visualization.  The gallbladder was visualized.  It was intrahepatic.  The gallbladder was distended and had thickened wall consistent with acute inflammation and also had evidence of chronic inflammation because there was  just really thick peritoneum around it. The gallbladder was grasped and a small defect was made in the gallbladder with mainly leakage of some clear fluid.  There was not any gallstone spillage.  The infundibulum was grasped.  It appeared that perhaps infundibulum and the cystic junction had been twisted due to the inflammation that it was almost turned back on itself.  I went about taking down the lateral peritoneum of the  gallbladder with hook electrocautery as well as the medial aspect.  I then did some blunt dissection with the suction irrigator catheter.  At this point, Dr. Marlou Starks joined me in the operating room and we looked back at the anatomy.  It appeared that we were grabbing the infundibulum out laterally and the cystic duct was slightly rotated.  We are able to achieve a window at the neck of the gallbladder and a small cystic artery was also identified.  We got large windows around the cystic duct at the junction with the gallbladder as well as the cystic artery. However, the cystic duct was very dilated.  It appeared to be dilated more from chronic inflammation around the cystic duct.  At this point, I did enlarge the subxiphoid trocar to a 12-mm so that I could use a larger clip applier.  The clip was placed across the cystic duct as it entered the gallbladder.  It was then partially transected in order to do a cholangiogram.  The cholangiogram catheter was inserted; however, it took 2 clips to secure it, however ,there was still some ongoing leakage of saline from around the cholangiogram catheter.  I did not want to place any additional clips further down since I had what appeared to be a short cystic duct stump potentially; therefore, we proceeded with a cholangiogram.  On initial injection of the contrast, there is frank extravasation, so therefore we reestablished pneumoperitoneum and I decide not to proceed with any further attempts at a cholangiogram due to the inflammatory nature of the cystic duct.  The clip was placed placed across the cystic duct at the level of the ductotomy.  It was then transected above that.  I was able to place 1 PDS endo-loop inferior and more proximal on the cystic duct stump and it was secured tightly.  The clip that had been placed at the very end of the cystic duct stump actually fell off and did not stay in, so it was removed. Two clips were placed on the  downside of the cystic artery and 1 distally into the gallbladder.  We then mobilized the gallbladder up at the gallbladder fossa.  The gallbladder was placed in the EndoCatch bag and placed above the liver.  The gallbladder fossa was inspected and irrigated.  There was no bleeding, there was no bile leak.  At this point, the gallbladder within the EndoCatch bag was extracted from the abdominal cavity.  The previously placed pursestring suture was tied down, thus obliterating fascial defect.  There was no air leak.  There was nothing within our fascial closure.  I reinspected the gallbladder fossa, it was still dry.  Remaining trocars were removed, the pneumoperitoneum was released.  Skin incisions were closed with a 4-0 Monocryl, followed by the application of benzoin, Steri-Strips, and sterile bandages.  The patient was extubated and brought to the recovery room in stable condition.  There were no immediate complications.  The patient tolerated the procedure well.  All needle, instrument, sponge counts were correct x2.  We will follow her  LFT trends to make sure that her LFTs trend down to rule out a common bile duct stone.     Leighton Ruff. Redmond Pulling, MD, FACS     EMW/MEDQ  D:  05/20/2014  T:  05/20/2014  Job:  [id number]

## 2014-05-21 NOTE — Progress Notes (Signed)
Patra Griffee to be D/C'd Home per MD order.  Discussed with the patient and all questions fully answered.  VSS, Skin clean, dry and intact without evidence of skin break down, no evidence of skin tears noted.  Surgical dressings clean, dry, intact with no sign of infection noted.    IV catheter discontinued intact. Site without signs and symptoms of complications. Dressing and pressure applied.  An After Visit Summary was printed and given to the patient.  Patient was also given referral to have LFT's checked on 8/13 outpatient.    D/c education completed with patient/family including follow up instructions, medication list, d/c activities limitations if indicated, with other d/c instructions as indicated by MD - patient able to verbalize understanding, all questions fully answered.   Patient instructed to return to ED, call 911, or call MD for any changes in condition.   Patient escorted via [place], and D/C home via private auto.  Micki Riley 05/21/2014 11:55 AM

## 2014-05-21 NOTE — Discharge Summary (Signed)
Physician Discharge Summary  Patient ID: [name] MRN: [id number] DOB/AGE: 33/33/1982 33 y.o.  Admit date: 05/20/2014 Discharge date: 05/21/2014  Admitting Diagnosis:   Discharge Diagnosis Patient Active Problem List   Diagnosis Date Noted  . Acute on chronic cholecystitis 05/20/2014  . Dysuria 05/20/2014  . Hypothyroidism (acquired) 05/20/2014  . Bilateral low back pain without sciatica 05/14/2014    Consultants none  Imaging: Dg Cholangiogram Operative  05/20/2014   CLINICAL DATA:  Cholelithiasis.  EXAM: INTRAOPERATIVE CHOLANGIOGRAM  TECHNIQUE: Cholangiographic images from the C-arm fluoroscopic device were submitted for interpretation post-operatively. Please see the procedural report for the amount of contrast and the fluoroscopy time utilized.  COMPARISON:  Ultrasound 05/20/2014  FINDINGS: There is no contrast in the biliary system. The contrast was extravasating and accumulating along the liver margin.  IMPRESSION: Non visualization of the biliary system.   Electronically Signed   By: Markus Daft M.D.   On: 05/20/2014 14:55   US Abdomen Complete  05/20/2014   CLINICAL DATA:  Abdominal pain  EXAM: ULTRASOUND ABDOMEN COMPLETE  COMPARISON:  None.  FINDINGS: Gallbladder:  Within the gallbladder, there are multiple echogenic foci which move and shadow consistent with gallstones. Largest individual gallstone measures 1.0 cm in length. There is no gallbladder wall thickening or pericholecystic fluid. No sonographic Murphy sign noted.  Common bile duct:  Diameter: 5 mm. There is no intrahepatic, common hepatic, or common bile duct dilatation.  Liver:  No focal lesion identified. Within normal limits in parenchymal echogenicity.  IVC:  No abnormality visualized.  Pancreas:  Visualized portion unremarkable. Portions of the pancreas are obscured by gas, primarily in the tail region.  Spleen:  Size and appearance within normal limits.  Right Kidney:  Length: 10.9 cm. Echogenicity within normal  limits. No mass or hydronephrosis visualized.  Left Kidney:  Length: 11.2 cm. Echogenicity within normal limits. No mass or hydronephrosis visualized.  Abdominal aorta:  No aneurysm visualized.  Other findings:  No demonstrable ascites.  IMPRESSION: Cholelithiasis. Portions of the pancreas are obscured by gas. Visualized portions of pancreas appear normal. Study otherwise unremarkable.   Electronically Signed   By: Lowella Grip M.D.   On: 05/20/2014 07:37    Procedures Laparoscopic cholecystectomy with attempted IOC---Dr. 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 Her AST/ALT have increased to 496/933, bilirubin is normal. Normal white count. [place] of abdomen positive for stones, normal CBD, no gallbladder wall thickening or pericholecystic fluid.  Patient was admitted and underwent procedure listed above.  Tolerated procedure well and was transferred to the floor.  Diet was advanced as tolerated.  On POD#1, the patient was voiding well, tolerating diet, ambulating well, pain well controlled, vital signs stable, incisions c/d/i and felt stable for discharge home. Medication risks, benefits and therapeutic alternatives were reviewed with the patient.  She verbalizes understanding.  She will have repeat LFTs in one week to ensure they continue to trend down.   Patient will follow up in our office in 3 weeks and knows to call with questions or concerns.  Physical Exam: General:  Alert, NAD, pleasant, comfortable Abd:  Soft, ND, mild tenderness, incisions C/D/I    Medication List         acetaminophen 325 MG tablet  Commonly known as:  TYLENOL  Take 2 tablets (  650 mg total) by mouth every 6 (six) hours as needed for mild pain (or Temp > 100).     HYDROcodone-acetaminophen 5-325 MG per  tablet  Commonly known as:  NORCO/VICODIN  Take 1 tablet by mouth every 6 (six) hours as needed for moderate pain.     ibuprofen 800 MG tablet  Commonly known as:  ADVIL,MOTRIN  Take 1 tablet (800 mg total) by mouth every 8 (eight) hours as needed for moderate pain.     levothyroxine 75 MCG tablet  Commonly known as:  SYNTHROID, LEVOTHROID  Take 75 mcg by mouth daily before breakfast.     pantoprazole 40 MG tablet  Commonly known as:  PROTONIX  Take 1 tablet (40 mg total) by mouth daily.             Follow-up Information   Follow up with [facility] On 06/16/2014. (arrive by 3PM for a 3:30PM post operative check up)    Contact information:   [address]   [place] [address] [phone]       Signed: Erby Pian, [facility] [phone]  05/21/2014, 11:37 AM

## 2014-05-21 NOTE — Progress Notes (Signed)
UR completed 

## 2014-05-22 ENCOUNTER — Telehealth (INDEPENDENT_AMBULATORY_CARE_PROVIDER_SITE_OTHER): Payer: Self-pay | Admitting: *Deleted

## 2014-05-22 ENCOUNTER — Other Ambulatory Visit (INDEPENDENT_AMBULATORY_CARE_PROVIDER_SITE_OTHER): Payer: Self-pay | Admitting: *Deleted

## 2014-05-22 DIAGNOSIS — N390 Urinary tract infection, site not specified: Secondary | ICD-10-CM

## 2014-05-22 LAB — URINE CULTURE: Colony Count: >=100000

## 2014-05-22 MED ORDER — CIPROFLOXACIN HCL 250 MG PO TABS
250.0000 mg | ORAL_TABLET | Freq: Two times a day (BID) | ORAL | Status: DC
Start: 2014-05-22 — End: 2017-10-24

## 2014-05-22 NOTE — Telephone Encounter (Signed)
Called pt, per Emina, to inform her that her UA did show a infection and to advise her that we called in Cipro to her pharmacy.  [facility], [place], [place] per pt requests.  Anderson Malta

## 2014-06-16 ENCOUNTER — Encounter (INDEPENDENT_AMBULATORY_CARE_PROVIDER_SITE_OTHER): Payer: BC Managed Care – PPO

## 2015-04-04 ENCOUNTER — Emergency Department (HOSPITAL_COMMUNITY)
Admission: EM | Admit: 2015-04-04 | Discharge: 2015-04-04 | Disposition: A | Discharge status: Home / Self Care | Payer: BLUE CROSS/BLUE SHIELD | Attending: Emergency Medicine | Admitting: Emergency Medicine

## 2015-04-04 ENCOUNTER — Encounter (HOSPITAL_COMMUNITY): Payer: Self-pay | Admitting: Emergency Medicine

## 2015-04-04 ENCOUNTER — Emergency Department (HOSPITAL_COMMUNITY): Payer: BLUE CROSS/BLUE SHIELD

## 2015-04-04 DIAGNOSIS — Z86018 Personal history of other benign neoplasm: Secondary | ICD-10-CM | POA: Diagnosis not present

## 2015-04-04 DIAGNOSIS — Y92481 Parking lot as the place of occurrence of the external cause: Secondary | ICD-10-CM | POA: Diagnosis not present

## 2015-04-04 DIAGNOSIS — S199XXA Unspecified injury of neck, initial encounter: Secondary | ICD-10-CM | POA: Diagnosis not present

## 2015-04-04 DIAGNOSIS — Y998 Other external cause status: Secondary | ICD-10-CM | POA: Diagnosis not present

## 2015-04-04 DIAGNOSIS — Z862 Personal history of diseases of the blood and blood-forming organs and certain disorders involving the immune mechanism: Secondary | ICD-10-CM | POA: Insufficient documentation

## 2015-04-04 DIAGNOSIS — Z3202 Encounter for pregnancy test, result negative: Secondary | ICD-10-CM | POA: Insufficient documentation

## 2015-04-04 DIAGNOSIS — Y92410 Unspecified street and highway as the place of occurrence of the external cause: Secondary | ICD-10-CM | POA: Diagnosis not present

## 2015-04-04 DIAGNOSIS — Y990 Civilian activity done for income or pay: Secondary | ICD-10-CM | POA: Insufficient documentation

## 2015-04-04 DIAGNOSIS — S060X9A Concussion with loss of consciousness of unspecified duration, initial encounter: Secondary | ICD-10-CM | POA: Diagnosis not present

## 2015-04-04 DIAGNOSIS — M549 Dorsalgia, unspecified: Secondary | ICD-10-CM

## 2015-04-04 DIAGNOSIS — Y9389 Activity, other specified: Secondary | ICD-10-CM | POA: Insufficient documentation

## 2015-04-04 DIAGNOSIS — N390 Urinary tract infection, site not specified: Secondary | ICD-10-CM

## 2015-04-04 DIAGNOSIS — M25561 Pain in right knee: Secondary | ICD-10-CM

## 2015-04-04 DIAGNOSIS — G43909 Migraine, unspecified, not intractable, without status migrainosus: Secondary | ICD-10-CM | POA: Diagnosis not present

## 2015-04-04 DIAGNOSIS — E018 Other iodine-deficiency related thyroid disorders and allied conditions: Secondary | ICD-10-CM | POA: Insufficient documentation

## 2015-04-04 DIAGNOSIS — Z8679 Personal history of other diseases of the circulatory system: Secondary | ICD-10-CM | POA: Insufficient documentation

## 2015-04-04 DIAGNOSIS — S8991XA Unspecified injury of right lower leg, initial encounter: Secondary | ICD-10-CM | POA: Insufficient documentation

## 2015-04-04 DIAGNOSIS — R42 Dizziness and giddiness: Secondary | ICD-10-CM | POA: Diagnosis present

## 2015-04-04 DIAGNOSIS — M542 Cervicalgia: Secondary | ICD-10-CM

## 2015-04-04 DIAGNOSIS — Z79899 Other long term (current) drug therapy: Secondary | ICD-10-CM | POA: Insufficient documentation

## 2015-04-04 DIAGNOSIS — S299XXA Unspecified injury of thorax, initial encounter: Secondary | ICD-10-CM | POA: Diagnosis not present

## 2015-04-04 LAB — BASIC METABOLIC PANEL
ANION GAP: 10 (ref 5–15)
BUN: 10 mg/dL (ref 6–20)
CO2: 26 mmol/L (ref 22–32)
CREATININE: 0.91 mg/dL (ref 0.44–1.00)
Calcium: 9.4 mg/dL (ref 8.9–10.3)
Chloride: 102 mmol/L (ref 101–111)
GLUCOSE: 89 mg/dL (ref 65–99)
Potassium: 3.4 mmol/L — ABNORMAL LOW (ref 3.5–5.1)
Sodium: 138 mmol/L (ref 135–145)

## 2015-04-04 LAB — CBC
HEMATOCRIT: 36.9 % (ref 36.0–46.0)
Hemoglobin: 12.2 g/dL (ref 12.0–15.0)
MCH: 24.2 pg — ABNORMAL LOW (ref 26.0–34.0)
MCHC: 33.1 g/dL (ref 30.0–36.0)
MCV: 73.2 fL — ABNORMAL LOW (ref 78.0–100.0)
Platelets: 301 10*3/uL (ref 150–400)
RBC: 5.04 MIL/uL (ref 3.87–5.11)
RDW: 17.5 % — AB (ref 11.5–15.5)
WBC: 9.1 10*3/uL (ref 4.0–10.5)

## 2015-04-04 LAB — URINE MICROSCOPIC-ADD ON

## 2015-04-04 LAB — URINALYSIS, ROUTINE W REFLEX MICROSCOPIC
BILIRUBIN URINE: NEGATIVE
GLUCOSE, UA: NEGATIVE mg/dL
HGB URINE DIPSTICK: NEGATIVE
KETONES UR: NEGATIVE mg/dL
Nitrite: POSITIVE — AB
PROTEIN: NEGATIVE mg/dL
Specific Gravity, Urine: 1.018 (ref 1.005–1.030)
Urobilinogen, UA: 0.2 mg/dL (ref 0.0–1.0)
pH: 6 (ref 5.0–8.0)

## 2015-04-04 LAB — POC URINE PREG, ED: Preg Test, Ur: NEGATIVE

## 2015-04-04 MED ORDER — SODIUM CHLORIDE 0.9 % IV BOLUS (SEPSIS)
1000.0000 mL | Freq: Once | INTRAVENOUS | Status: AC
  Administered 2015-04-04: 1000 mL via INTRAVENOUS

## 2015-04-04 MED ORDER — IBUPROFEN 800 MG PO TABS
800.0000 mg | ORAL_TABLET | Freq: Once | ORAL | Status: AC
  Administered 2015-04-04: 800 mg via ORAL
  Filled 2015-04-04: qty 1

## 2015-04-04 MED ORDER — CEPHALEXIN 500 MG PO CAPS: 500.0000 mg | ORAL_CAPSULE | Freq: Four times a day (QID) | ORAL | Status: DC

## 2015-04-04 MED ORDER — OXYCODONE-ACETAMINOPHEN 5-325 MG PO TABS
1.0000 | ORAL_TABLET | Freq: Once | ORAL | Status: AC
  Administered 2015-04-04: 1 via ORAL
  Filled 2015-04-04: qty 1

## 2015-04-04 MED ORDER — CYCLOBENZAPRINE HCL 10 MG PO TABS: 10.0000 mg | ORAL_TABLET | Freq: Two times a day (BID) | ORAL | Status: DC | PRN

## 2015-04-04 MED ORDER — ONDANSETRON 4 MG PO TBDP
4.0000 mg | ORAL_TABLET | Freq: Once | ORAL | Status: AC
  Administered 2015-04-04: 4 mg via ORAL
  Filled 2015-04-04: qty 1

## 2015-04-04 MED ORDER — OXYCODONE-ACETAMINOPHEN 5-325 MG PO TABS
1.0000 | ORAL_TABLET | ORAL | Status: DC | PRN
Start: 2015-04-04 — End: 2017-10-24

## 2015-04-04 MED ORDER — IBUPROFEN 800 MG PO TABS: 800.0000 mg | ORAL_TABLET | Freq: Three times a day (TID) | ORAL | Status: DC
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
## 2015-04-04 NOTE — Discharge Instructions (Signed)
Urinary Tract Infection Urinary tract infections (UTIs) can develop anywhere along your urinary tract. Your urinary tract is your body's drainage system for removing wastes and extra water. Your urinary tract includes two kidneys, two ureters, a bladder, and a urethra. Your kidneys are a pair of bean-shaped organs. Each kidney is about the size of your fist. They are located below your ribs, one on each side of your spine. CAUSES Infections are caused by microbes, which are microscopic organisms, including fungi, viruses, and bacteria. These organisms are so small that they can only be seen through a microscope. Bacteria are the microbes that most commonly cause UTIs. SYMPTOMS  Symptoms of UTIs may vary by age and gender of the patient and by the location of the infection. Symptoms in young women typically include a frequent and intense urge to urinate and a painful, burning feeling in the bladder or urethra during urination. Older women and men are more likely to be tired, shaky, and weak and have muscle aches and abdominal pain. A fever may mean the infection is in your kidneys. Other symptoms of a kidney infection include pain in your back or sides below the ribs, nausea, and vomiting. DIAGNOSIS To diagnose a UTI, your caregiver will ask you about your symptoms. Your caregiver also will ask to provide a urine sample. The urine sample will be tested for bacteria and white blood cells. White blood cells are made by your body to help fight infection. TREATMENT  Typically, UTIs can be treated with medication. Because most UTIs are caused by a bacterial infection, they usually can be treated with the use of antibiotics. The choice of antibiotic and length of treatment depend on your symptoms and the type of bacteria causing your infection. HOME CARE INSTRUCTIONS  If you were prescribed antibiotics, take them exactly as your caregiver instructs you. Finish the medication even if you feel better after you  have only taken some of the medication.  Drink enough water and fluids to keep your urine clear or pale yellow.  Avoid caffeine, tea, and carbonated beverages. They tend to irritate your bladder.  Empty your bladder often. Avoid holding urine for long periods of time.  Empty your bladder before and after sexual intercourse.  After a bowel movement, women should cleanse from front to back. Use each tissue only once. SEEK MEDICAL CARE IF:   You have back pain.  You develop a fever.  Your symptoms do not begin to resolve within 3 days. SEEK IMMEDIATE MEDICAL CARE IF:   You have severe back pain or lower abdominal pain.  You develop chills.  You have nausea or vomiting.  You have continued burning or discomfort with urination. MAKE SURE YOU:   Understand these instructions.  Will watch your condition.  Will get help right away if you are not doing well or get worse. Document Released: 07/12/2005 Document Revised: 04/02/2012 Document Reviewed: 11/10/2011 [facility] Patient Information 2015 [place], [place]. This information is not intended to replace advice given to you by your health care provider. Make sure you discuss any questions you have with your health care provider. Knee Pain The knee is the complex joint between your thigh and your lower leg. It is made up of bones, tendons, ligaments, and cartilage. The bones that make up the knee are:  The femur in the thigh.  The tibia and fibula in the lower leg.  The patella or kneecap riding in the groove on the lower femur. CAUSES  Knee pain is a  common complaint with many causes. A few of these causes are:  Injury, such as:  A ruptured ligament or tendon injury.  Torn cartilage.  Medical conditions, such as:  Gout  Arthritis  Infections  Overuse, over training, or overdoing a physical activity. Knee pain can be minor or severe. Knee pain can accompany debilitating injury. Minor knee problems often respond well  to self-care measures or get well on their own. More serious injuries may need medical intervention or even surgery. SYMPTOMS The knee is complex. Symptoms of knee problems can vary widely. Some of the problems are:  Pain with movement and weight bearing.  Swelling and tenderness.  Buckling of the knee.  Inability to straighten or extend your knee.  Your knee locks and you cannot straighten it.  Warmth and redness with pain and fever.  Deformity or dislocation of the kneecap. DIAGNOSIS  Determining what is wrong may be very straight forward such as when there is an injury. It can also be challenging because of the complexity of the knee. Tests to make a diagnosis may include:  Your caregiver taking a history and doing a physical exam.  Routine X-rays can be used to rule out other problems. X-rays will not reveal a cartilage tear. Some injuries of the knee can be diagnosed by:  Arthroscopy a surgical technique by which a small video camera is inserted through tiny incisions on the sides of the knee. This procedure is used to examine and repair internal knee joint problems. Tiny instruments can be used during arthroscopy to repair the torn knee cartilage (meniscus).  Arthrography is a radiology technique. A contrast liquid is directly injected into the knee joint. Internal structures of the knee joint then become visible on X-ray film.  An MRI scan is a non X-ray radiology procedure in which magnetic fields and a computer produce two- or three-dimensional images of the inside of the knee. Cartilage tears are often visible using an MRI scanner. MRI scans have largely replaced arthrography in diagnosing cartilage tears of the knee.  Blood work.  Examination of the fluid that helps to lubricate the knee joint (synovial fluid). This is done by taking a sample out using a needle and a syringe. TREATMENT The treatment of knee problems depends on the cause. Some of these treatments  are:  Depending on the injury, proper casting, splinting, surgery, or physical therapy care will be needed.  Give yourself adequate recovery time. Do not overuse your joints. If you begin to get sore during workout routines, back off. Slow down or do fewer repetitions.  For repetitive activities such as cycling or running, maintain your strength and nutrition.  Alternate muscle groups. For example, if you are a weight lifter, work the upper body on one day and the lower body the next.  Either tight or weak muscles do not give the proper support for your knee. Tight or weak muscles do not absorb the stress placed on the knee joint. Keep the muscles surrounding the knee strong.  Take care of mechanical problems.  If you have flat feet, orthotics or special shoes may help. See your caregiver if you need help.  Arch supports, sometimes with wedges on the inner or outer aspect of the heel, can help. These can shift pressure away from the side of the knee most bothered by osteoarthritis.  A brace called an "unloader" brace also may be used to help ease the pressure on the most arthritic side of the knee.  If your  caregiver has prescribed crutches, braces, wraps or ice, use as directed. The acronym for this is PRICE. This means protection, rest, ice, compression, and elevation.  Nonsteroidal anti-inflammatory drugs (NSAIDs), can help relieve pain. But if taken immediately after an injury, they may actually increase swelling. Take NSAIDs with food in your stomach. Stop them if you develop stomach problems. Do not take these if you have a history of ulcers, stomach pain, or bleeding from the bowel. Do not take without your caregiver's approval if you have problems with fluid retention, heart failure, or kidney problems.  For ongoing knee problems, physical therapy may be helpful.  Glucosamine and chondroitin are over-the-counter dietary supplements. Both may help relieve the pain of osteoarthritis  in the knee. These medicines are different from the usual anti-inflammatory drugs. Glucosamine may decrease the rate of cartilage destruction.  Injections of a corticosteroid drug into your knee joint may help reduce the symptoms of an arthritis flare-up. They may provide pain relief that lasts a few months. You may have to wait a few months between injections. The injections do have a small increased risk of infection, water retention, and elevated blood sugar levels.  Hyaluronic acid injected into damaged joints may ease pain and provide lubrication. These injections may work by reducing inflammation. A series of shots may give relief for as long as 6 months.  Topical painkillers. Applying certain ointments to your skin may help relieve the pain and stiffness of osteoarthritis. Ask your pharmacist for suggestions. Many over the-counter products are approved for temporary relief of arthritis pain.  In some countries, doctors often prescribe topical NSAIDs for relief of chronic conditions such as arthritis and tendinitis. A review of treatment with NSAID creams found that they worked as well as oral medications but without the serious side effects. PREVENTION  Maintain a healthy weight. Extra pounds put more strain on your joints.  Get strong, stay limber. Weak muscles are a common cause of knee injuries. Stretching is important. Include flexibility exercises in your workouts.  Be smart about exercise. If you have osteoarthritis, chronic knee pain or recurring injuries, you may need to change the way you exercise. This does not mean you have to stop being active. If your knees ache after jogging or playing basketball, consider switching to swimming, water aerobics, or other low-impact activities, at least for a few days a week. Sometimes limiting high-impact activities will provide relief.  Make sure your shoes fit well. Choose footwear that is right for your sport.  Protect your knees. Use the  proper gear for knee-sensitive activities. Use kneepads when playing volleyball or laying carpet. Buckle your seat belt every time you drive. Most shattered kneecaps occur in car accidents.  Rest when you are tired. SEEK MEDICAL CARE IF:  You have knee pain that is continual and does not seem to be getting better.  SEEK IMMEDIATE MEDICAL CARE IF:  Your knee joint feels hot to the touch and you have a high fever. MAKE SURE YOU:   Understand these instructions.  Will watch your condition.  Will get help right away if you are not doing well or get worse. Document Released: 07/30/2007 Document Revised: 12/25/2011 Document Reviewed: 07/30/2007 [facility] Patient Information 2015 [place], [place]. This information is not intended to replace advice given to you by your health care provider. Make sure you discuss any questions you have with your health care provider.  Concussion A concussion, or closed-head injury, is a brain injury caused by a direct blow to  the head or by a quick and sudden movement (jolt) of the head or neck. Concussions are usually not life-threatening. Even so, the effects of a concussion can be serious. If you have had a concussion before, you are more likely to experience concussion-like symptoms after a direct blow to the head.  CAUSES  Direct blow to the head, such as from running into another player during a soccer game, being hit in a fight, or hitting your head on a hard surface.  A jolt of the head or neck that causes the brain to move back and forth inside the skull, such as in a car crash. SIGNS AND SYMPTOMS The signs of a concussion can be hard to notice. Early on, they may be missed by you, family members, and health care providers. You may look fine but act or feel differently. Symptoms are usually temporary, but they may last for days, weeks, or even longer. Some symptoms may appear right away while others may not show up for hours or days. Every head injury is  different. Symptoms include:  Mild to moderate headaches that will not go away.  A feeling of pressure inside your head.  Having more trouble than usual:  Learning or remembering things you have heard.  Answering questions.  Paying attention or concentrating.  Organizing daily tasks.  Making decisions and solving problems.  Slowness in thinking, acting or reacting, speaking, or reading.  Getting lost or being easily confused.  Feeling tired all the time or lacking energy (fatigued).  Feeling drowsy.  Sleep disturbances.  Sleeping more than usual.  Sleeping less than usual.  Trouble falling asleep.  Trouble sleeping (insomnia).  Loss of balance or feeling lightheaded or dizzy.  Nausea or vomiting.  Numbness or tingling.  Increased sensitivity to:  Sounds.  Lights.  Distractions.  Vision problems or eyes that tire easily.  Diminished sense of taste or smell.  Ringing in the ears.  Mood changes such as feeling sad or anxious.  Becoming easily irritated or angry for little or no reason.  Lack of motivation.  Seeing or hearing things other people do not see or hear (hallucinations). DIAGNOSIS Your health care provider can usually diagnose a concussion based on a description of your injury and symptoms. He or she will ask whether you passed out (lost consciousness) and whether you are having trouble remembering events that happened right before and during your injury. Your evaluation might include:  A brain scan to look for signs of injury to the brain. Even if the test shows no injury, you may still have a concussion.  Blood tests to be sure other problems are not present. TREATMENT  Concussions are usually treated in an emergency department, in urgent care, or at a clinic. You may need to stay in the hospital overnight for further treatment.  Tell your health care provider if you are taking any medicines, including prescription medicines,  over-the-counter medicines, and natural remedies. Some medicines, such as blood thinners (anticoagulants) and aspirin, may increase the chance of complications. Also tell your health care provider whether you have had alcohol or are taking illegal drugs. This information may affect treatment.  Your health care provider will send you home with important instructions to follow.  How fast you will recover from a concussion depends on many factors. These factors include how severe your concussion is, what part of your brain was injured, your age, and how healthy you were before the concussion.  Most people with mild injuries  recover fully. Recovery can take time. In general, recovery is slower in older persons. Also, persons who have had a concussion in the past or have other medical problems may find that it takes longer to recover from their current injury. HOME CARE INSTRUCTIONS General Instructions  Carefully follow the directions your health care provider gave you.  Only take over-the-counter or prescription medicines for pain, discomfort, or fever as directed by your health care provider.  Take only those medicines that your health care provider has approved.  Do not drink alcohol until your health care provider says you are well enough to do so. Alcohol and certain other drugs may slow your recovery and can put you at risk of further injury.  If it is harder than usual to remember things, write them down.  If you are easily distracted, try to do one thing at a time. For example, do not try to watch TV while fixing dinner.  Talk with family members or close friends when making important decisions.  Keep all follow-up appointments. Repeated evaluation of your symptoms is recommended for your recovery.  Watch your symptoms and tell others to do the same. Complications sometimes occur after a concussion. Older adults with a brain injury may have a higher risk of serious complications, such  as a blood clot on the brain.  Tell your teachers, school nurse, school counselor, coach, athletic trainer, or work Freight forwarder about your injury, symptoms, and restrictions. Tell them about what you can or cannot do. They should watch for:  Increased problems with attention or concentration.  Increased difficulty remembering or learning new information.  Increased time needed to complete tasks or assignments.  Increased irritability or decreased ability to cope with stress.  Increased symptoms.  Rest. Rest helps the brain to heal. Make sure you:  Get plenty of sleep at night. Avoid staying up late at night.  Keep the same bedtime hours on weekends and weekdays.  Rest during the day. Take daytime naps or rest breaks when you feel tired.  Limit activities that require a lot of thought or concentration. These include:  Doing homework or job-related work.  Watching TV.  Working on the computer.  Avoid any situation where there is potential for another head injury (football, hockey, soccer, basketball, martial arts, downhill snow sports and horseback riding). Your condition will get worse every time you experience a concussion. You should avoid these activities until you are evaluated by the appropriate follow-up health care providers. Returning To Your Regular Activities You will need to return to your normal activities slowly, not all at once. You must give your body and brain enough time for recovery.  Do not return to sports or other athletic activities until your health care provider tells you it is safe to do so.  Ask your health care provider when you can drive, ride a bicycle, or operate heavy machinery. Your ability to react may be slower after a brain injury. Never do these activities if you are dizzy.  Ask your health care provider about when you can return to work or school. Preventing Another Concussion It is very important to avoid another brain injury, especially before  you have recovered. In rare cases, another injury can lead to permanent brain damage, brain swelling, or death. The risk of this is greatest during the first 7-10 days after a head injury. Avoid injuries by:  Wearing a seat belt when riding in a car.  Drinking alcohol only in moderation.  Wearing a  helmet when biking, skiing, skateboarding, skating, or doing similar activities.  Avoiding activities that could lead to a second concussion, such as contact or recreational sports, until your health care provider says it is okay.  Taking safety measures in your home.  Remove clutter and tripping hazards from floors and stairways.  Use grab bars in bathrooms and handrails by stairs.  Place non-slip mats on floors and in bathtubs.  Improve lighting in dim areas. SEEK MEDICAL CARE IF:  You have increased problems paying attention or concentrating.  You have increased difficulty remembering or learning new information.  You need more time to complete tasks or assignments than before.  You have increased irritability or decreased ability to cope with stress.  You have more symptoms than before. Seek medical care if you have any of the following symptoms for more than 2 weeks after your injury:  Lasting (chronic) headaches.  Dizziness or balance problems.  Nausea.  Vision problems.  Increased sensitivity to noise or light.  Depression or mood swings.  Anxiety or irritability.  Memory problems.  Difficulty concentrating or paying attention.  Sleep problems.  Feeling tired all the time. SEEK IMMEDIATE MEDICAL CARE IF:  You have severe or worsening headaches. These may be a sign of a blood clot in the brain.  You have weakness (even if only in one hand, leg, or part of the face).  You have numbness.  You have decreased coordination.  You vomit repeatedly.  You have increased sleepiness.  One pupil is larger than the other.  You have convulsions.  You have  slurred speech.  You have increased confusion. This may be a sign of a blood clot in the brain.  You have increased restlessness, agitation, or irritability.  You are unable to recognize people or places.  You have neck pain.  It is difficult to wake you up.  You have unusual behavior changes.  You lose consciousness. MAKE SURE YOU:  Understand these instructions.  Will watch your condition.  Will get help right away if you are not doing well or get worse. Document Released: 12/23/2003 Document Revised: 10/07/2013 Document Reviewed: 04/24/2013 [facility] Patient Information 2015 [place], [place]. This information is not intended to replace advice given to you by your health care provider. Make sure you discuss any questions you have with your health care provider.

## 2015-04-04 NOTE — Discharge Instructions (Signed)
Take the prescribed medication as directed. °Follow-up with your primary care physician. °Return to the ED for new or worsening symptoms. ° °

## 2015-04-04 NOTE — ED Notes (Signed)
Bed: WA19 Expected date:  Expected time:  Means of arrival:  Comments: 

## 2015-04-07 LAB — URINE CULTURE: Culture: >=100000

## 2015-04-08 ENCOUNTER — Telehealth (HOSPITAL_BASED_OUTPATIENT_CLINIC_OR_DEPARTMENT_OTHER): Payer: Self-pay | Admitting: Emergency Medicine

## 2015-04-08 NOTE — Telephone Encounter (Signed)
Post ED Visit - Positive Culture Follow-up  Culture report reviewed by antimicrobial stewardship pharmacist: []  Wes Cushing, Pharm.D., BCPS []  Heide Guile, Pharm.D., BCPS [x]  Alycia Rossetti, Pharm.D., BCPS []  [place], Pharm.D., BCPS, AAHIVP []  Legrand Como, Pharm.D., BCPS, AAHIVP []  Isac Sarna, Pharm.D., BCPS  Positive urine culture E. Coli Treated with cephalexin, organism sensitive to the same and no further patient follow-up is required at this time.  Hazle Nordmann 04/08/2015, 10:26 AM

## 2015-05-15 ENCOUNTER — Emergency Department (INDEPENDENT_AMBULATORY_CARE_PROVIDER_SITE_OTHER)
Admission: EM | Admit: 2015-05-15 | Discharge: 2015-05-15 | Disposition: A | Discharge status: Home / Self Care | Payer: Self-pay | Source: Home / Self Care | Attending: Family Medicine | Admitting: Family Medicine

## 2015-05-15 ENCOUNTER — Encounter (HOSPITAL_COMMUNITY): Payer: Self-pay | Admitting: Emergency Medicine

## 2015-05-15 DIAGNOSIS — M25522 Pain in left elbow: Secondary | ICD-10-CM

## 2015-05-15 MED ORDER — DICLOFENAC SODIUM 75 MG PO TBEC: DELAYED_RELEASE_TABLET | ORAL | Status: DC
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
## 2015-05-15 NOTE — Discharge Instructions (Signed)
Arthralgia °Your caregiver has diagnosed you as suffering from an arthralgia. Arthralgia means there is pain in a joint. This can come from many reasons including: °· Bruising the joint which causes soreness (inflammation) in the joint. °· Wear and tear on the joints which occur as we grow older (osteoarthritis). °· Overusing the joint. °· Various forms of arthritis. °· Infections of the joint. °Regardless of the cause of pain in your joint, most of these different pains respond to anti-inflammatory drugs and rest. The exception to this is when a joint is infected, and these cases are treated with antibiotics, if it is a bacterial infection. °HOME CARE INSTRUCTIONS  °· Rest the injured area for as long as directed by your caregiver. Then slowly start using the joint as directed by your caregiver and as the pain allows. Crutches as directed may be useful if the ankles, knees or hips are involved. If the knee was splinted or casted, continue use and care as directed. If an stretchy or elastic wrapping bandage has been applied today, it should be removed and re-applied every 3 to 4 hours. It should not be applied tightly, but firmly enough to keep swelling down. Watch toes and feet for swelling, bluish discoloration, coldness, numbness or excessive pain. If any of these problems (symptoms) occur, remove the ace bandage and re-apply more loosely. If these symptoms persist, contact your caregiver or return to this location. °· For the first 24 hours, keep the injured extremity elevated on pillows while lying down. °· Apply ice for 15-20 minutes to the sore joint every couple hours while awake for the first half day. Then 03-04 times per day for the first 48 hours. Put the ice in a plastic bag and place a towel between the bag of ice and your skin. °· Wear any splinting, casting, elastic bandage applications, or slings as instructed. °· Only take over-the-counter or prescription medicines for pain, discomfort, or fever as  directed by your caregiver. Do not use aspirin immediately after the injury unless instructed by your physician. Aspirin can cause increased bleeding and bruising of the tissues. °· If you were given crutches, continue to use them as instructed and do not resume weight bearing on the sore joint until instructed. °Persistent pain and inability to use the sore joint as directed for more than 2 to 3 days are warning signs indicating that you should see a caregiver for a follow-up visit as soon as possible. Initially, a hairline fracture (break in bone) may not be evident on X-rays. Persistent pain and swelling indicate that further evaluation, non-weight bearing or use of the joint (use of crutches or slings as instructed), or further X-rays are indicated. X-rays may sometimes not show a small fracture until a week or 10 days later. Make a follow-up appointment with your own caregiver or one to whom we have referred you. A radiologist (specialist in reading X-rays) may read your X-rays. Make sure you know how you are to obtain your X-ray results. Do not assume everything is normal if you do not hear from us. °SEEK MEDICAL CARE IF: °Bruising, swelling, or pain increases. °SEEK IMMEDIATE MEDICAL CARE IF:  °· Your fingers or toes are numb or blue. °· The pain is not responding to medications and continues to stay the same or get worse. °· The pain in your joint becomes severe. °· You develop a fever over 102° F (38.9° C). °· It becomes impossible to move or use the joint. °MAKE SURE YOU:  °·   Understand these instructions.  Will watch your condition.  Will get help right away if you are not doing well or get worse. Document Released: 10/02/2005 Document Revised: 12/25/2011 Document Reviewed: 05/20/2008 [facility] Patient Information 2015 [place], [place]. This information is not intended to replace advice given to you by your health care provider. Make sure you discuss any questions you have with your health care  provider.   Probable musculoskeletal pain from going back to work, or PT. No emergent needs today. F/U with Orthopedic as scheduled
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
## 2016-11-13 DIAGNOSIS — E890 Postprocedural hypothyroidism: Secondary | ICD-10-CM | POA: Diagnosis not present

## 2017-01-08 DIAGNOSIS — E890 Postprocedural hypothyroidism: Secondary | ICD-10-CM | POA: Diagnosis not present

## 2017-01-08 DIAGNOSIS — J301 Allergic rhinitis due to pollen: Secondary | ICD-10-CM | POA: Diagnosis not present

## 2017-02-26 DIAGNOSIS — E890 Postprocedural hypothyroidism: Secondary | ICD-10-CM | POA: Diagnosis not present

## 2017-07-31 DIAGNOSIS — Z23 Encounter for immunization: Secondary | ICD-10-CM | POA: Diagnosis not present

## 2017-08-22 DIAGNOSIS — Z01419 Encounter for gynecological examination (general) (routine) without abnormal findings: Secondary | ICD-10-CM | POA: Diagnosis not present

## 2017-08-22 DIAGNOSIS — N760 Acute vaginitis: Secondary | ICD-10-CM | POA: Diagnosis not present

## 2017-08-22 DIAGNOSIS — N921 Excessive and frequent menstruation with irregular cycle: Secondary | ICD-10-CM | POA: Diagnosis not present

## 2017-09-17 DIAGNOSIS — R102 Pelvic and perineal pain: Secondary | ICD-10-CM | POA: Diagnosis not present

## 2017-09-17 DIAGNOSIS — N921 Excessive and frequent menstruation with irregular cycle: Secondary | ICD-10-CM | POA: Diagnosis not present

## 2017-10-02 DIAGNOSIS — E890 Postprocedural hypothyroidism: Secondary | ICD-10-CM | POA: Diagnosis not present

## 2017-10-03 ENCOUNTER — Other Ambulatory Visit: Payer: Self-pay | Admitting: Endocrinology

## 2017-10-03 DIAGNOSIS — E890 Postprocedural hypothyroidism: Secondary | ICD-10-CM

## 2017-10-24 ENCOUNTER — Ambulatory Visit (HOSPITAL_COMMUNITY)
Admission: EM | Admit: 2017-10-24 | Discharge: 2017-10-24 | Disposition: A | Discharge status: Home / Self Care | Payer: 59 | Attending: Family Medicine | Admitting: Family Medicine

## 2017-10-24 ENCOUNTER — Encounter (HOSPITAL_COMMUNITY): Payer: Self-pay | Admitting: Emergency Medicine

## 2017-10-24 DIAGNOSIS — J069 Acute upper respiratory infection, unspecified: Secondary | ICD-10-CM

## 2017-10-24 MED ORDER — HYDROCODONE-HOMATROPINE 5-1.5 MG/5ML PO SYRP: 5.0000 mL | ORAL_SOLUTION | Freq: Four times a day (QID) | ORAL | 0 refills | Status: DC | PRN

## 2017-10-24 NOTE — ED Triage Notes (Signed)
PT C/O: cold sx onset 5 days associated w/nasal drainage/congestion, sore throat, facial pressure, diaphoresis, prod cough, heart palpitations   DENIES: fevers  TAKING MEDS: Zyrtec and nasal spray and cough syrup   A&O x4... NAD... Ambulatory

## 2017-10-25 ENCOUNTER — Ambulatory Visit
Admission: RE | Admit: 2017-10-25 | Discharge: 2017-10-25 | Disposition: A | Discharge status: Home / Self Care | Payer: BLUE CROSS/BLUE SHIELD | Source: Ambulatory Visit | Attending: Endocrinology | Admitting: Endocrinology

## 2017-10-25 DIAGNOSIS — E038 Other specified hypothyroidism: Secondary | ICD-10-CM | POA: Diagnosis not present

## 2017-10-25 DIAGNOSIS — E890 Postprocedural hypothyroidism: Secondary | ICD-10-CM

## 2017-10-30 NOTE — ED Provider Notes (Signed)
  [facility]   [id number] 10/24/17 Arrival Time: 9509  ASSESSMENT & PLAN:  1. Viral upper respiratory tract infection     Meds ordered this encounter  Medications  . HYDROcodone-homatropine (HYCODAN) 5-1.5 MG/5ML syrup    Sig: Take 5 mLs by mouth every 6 (six) hours as needed for cough.    Dispense:  90 mL    Refill:  0   Medication sedation precautions. Discussed typical duration of symptoms. OTC symptom care as needed. Ensure adequate fluid intake and rest. May f/u with PCP or here as needed.  Reviewed expectations re: course of current medical issues. Questions answered. Outlined signs and symptoms indicating need for more acute intervention. Patient verbalized understanding. After Visit Summary given.   SUBJECTIVE: History from: patient.  [name] is a 37 y.o. female who presents with complaint of nasal congestion, post-nasal drainage, and a persistent dry cough. Onset abrupt, approximately several days ago. Overall fatigued with body aches. SOB: none. Wheezing: none. Fever: questions subjective at onset. Overall normal PO intake without n/v. Sick contacts: no. OTC treatment: cough syrup without relief.   Social History   Tobacco Use  Smoking Status Never Smoker  Smokeless Tobacco Never Used    ROS: As per HPI.   OBJECTIVE:  Vitals:   10/24/17 1314  BP: (!) 142/81  Pulse: 96  Resp: 20  Temp: 98.8 F (37.1 C)  TempSrc: Oral  SpO2: 100%     General appearance: alert; appears fatigued HEENT: nasal congestion; clear runny nose; throat irritation secondary to post-nasal drainage Neck: supple without LAD Lungs: unlabored respirations, symmetrical air entry; cough: moderate; no respiratory distress Skin: warm and dry Psychological: alert and cooperative; normal mood and affect  No Known Allergies  Past Medical History:  Diagnosis Date  . Anemia   . Fibroids   . History of blood transfusion    "related to fibroids"  . Iodine  hypothyroidism    "took radiation iodine pill in 2011  . Migraines    "last one was years ago" (05/20/2014)  . Thyroid disease    Family History  Problem Relation Age of Onset  . Lupus Mother   . Diabetes Father    Social History   Socioeconomic History  . Marital status: Single    Spouse name: Not on file  . Number of children: Not on file  . Years of education: Not on file  . Highest education level: Not on file  Social Needs  . Financial resource strain: Not on file  . Food insecurity - worry: Not on file  . Food insecurity - inability: Not on file  . Transportation needs - medical: Not on file  . Transportation needs - non-medical: Not on file  Occupational History  . Not on file  Tobacco Use  . Smoking status: Never Smoker  . Smokeless tobacco: Never Used  Substance and Sexual Activity  . Alcohol use: Yes    Comment: social  . Drug use: No  . Sexual activity: Yes    Birth control/protection: None  Other Topics Concern  . Not on file  Social History Narrative  . Not on file           Vanessa Kick, MD 10/30/17 [phone]

## 2018-04-08 DIAGNOSIS — J019 Acute sinusitis, unspecified: Secondary | ICD-10-CM | POA: Diagnosis not present

## 2018-04-26 ENCOUNTER — Encounter (HOSPITAL_COMMUNITY): Payer: Self-pay | Admitting: Emergency Medicine

## 2018-04-26 ENCOUNTER — Ambulatory Visit (HOSPITAL_COMMUNITY)
Admission: EM | Admit: 2018-04-26 | Discharge: 2018-04-26 | Disposition: A | Discharge status: Home / Self Care | Payer: 59 | Attending: Family Medicine | Admitting: Family Medicine

## 2018-04-26 DIAGNOSIS — R55 Syncope and collapse: Secondary | ICD-10-CM | POA: Diagnosis not present

## 2018-04-26 DIAGNOSIS — E162 Hypoglycemia, unspecified: Secondary | ICD-10-CM

## 2018-04-26 LAB — POCT I-STAT, CHEM 8
BUN: 9 mg/dL (ref 6–20)
CHLORIDE: 100 mmol/L (ref 98–111)
Calcium, Ion: 1.2 mmol/L (ref 1.15–1.40)
Creatinine, Ser: 0.9 mg/dL (ref 0.44–1.00)
Glucose, Bld: 68 mg/dL — ABNORMAL LOW (ref 70–99)
HCT: 38 % (ref 36.0–46.0)
Hemoglobin: 12.9 g/dL (ref 12.0–15.0)
Potassium: 4.2 mmol/L (ref 3.5–5.1)
SODIUM: 137 mmol/L (ref 135–145)
TCO2: 28 mmol/L (ref 22–32)

## 2018-04-26 NOTE — ED Provider Notes (Addendum)
[facility]    CSN: [id number] Arrival date & time: 04/26/18  1432     History   Chief Complaint No chief complaint on file.   HPI [name] is a 37 y.o. female.   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  She felt lightheaded.  She told a coworker who helped to move her to a new area where it was cooler.  A nurse took her blood pressure it was 110/78 with a heart rate over 102.  Temp 97.8.  She got an ice pack to her neck, was fanning.  Had taken all of her medicines as per instruction.  No recent illness or fever.  She does have history of fibroids with heavy menstrual periods her last hemoglobin was 10.  The nurse did repeat her blood pressure 5 minutes later and it was 112/82 with a heart rate of 96.  Patient states she has not had this reaction previously.  She is never had trouble with her blood sugar.  Her doctor did tell her long time that she was "borderline" for diabetes.  She has never been pregnant.  She is not sexually active.  She is not pregnant now.  She is eating and drinking normally.  No recent illness.  The spell past, and she feels fine now.  She is here at the request of the health department nurse. Patient has never had fainting or syncope. She is on not on medicine for blood pressure.  No heart disease.  She did not feel any palpitations, chest pain or pressure, or skipped heartbeats. She denies stress.  She denies anxiety.  She denies any history of panic reaction.  Past Medical History:  Diagnosis Date  . Anemia   . Fibroids   . History of blood transfusion    "related to fibroids"  . 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 Migraines    "last one was years ago" (05/20/2014)  . Thyroid disease     Patient Active  Problem List   Diagnosis Date Noted  . Acute on chronic cholecystitis 05/20/2014  . Dysuria 05/20/2014  . Hypothyroidism (acquired) 05/20/2014  . Bilateral low back pain without sciatica 05/14/2014    Past Surgical History:  Procedure Laterality Date  . CHOLECYSTECTOMY N/A 05/20/2014   Procedure: LAPAROSCOPIC CHOLECYSTECTOMY WITH INTRAOPERATIVE CHOLANGIOGRAM;  Surgeon: Gayland Curry, MD;  Location: [facility];  Service: General;  Laterality: N/A;  . LAPAROSCOPIC CHOLECYSTECTOMY  05/20/2014  . MYOMECTOMY  04/2013    OB History   None      Home Medications    Prior to Admission medications   Medication Sig Start Date End Date Taking? Authorizing Provider  levothyroxine (SYNTHROID, LEVOTHROID) 88 MCG tablet Take 88 mcg by mouth daily before breakfast.    [provider]  norethindrone-ethinyl estradiol (VYFEMLA) 0.4-35 MG-MCG tablet Take 1 tablet by mouth daily.    [provider]    Family History Family History  Problem Relation Age of Onset  . Lupus Mother   . Diabetes Father     Social History Social History   Tobacco Use  . Smoking status: Never Smoker  . Smokeless tobacco: Never Used  Substance Use Topics  . Alcohol  use: Yes    Comment: social  . Drug use: No     Allergies   Patient has no known allergies.   Review of Systems Review of Systems  Constitutional: Positive for diaphoresis. Negative for chills and fever.  HENT: Negative for congestion, dental problem, ear pain, postnasal drip, rhinorrhea, sinus pressure and sore throat.   Eyes: Positive for visual disturbance. Negative for pain.  Respiratory: Negative for cough and shortness of breath.   Cardiovascular: Negative for chest pain and palpitations.  Gastrointestinal: Negative for abdominal pain and vomiting.  Genitourinary: Negative for dysuria and hematuria.  Musculoskeletal: Negative for arthralgias and back pain.  Skin: Negative for color change and rash.  Neurological: Positive  for light-headedness. Negative for dizziness, seizures, syncope and headaches.  Psychiatric/Behavioral: Negative for dysphoric mood. The patient is not nervous/anxious.   All other systems reviewed and are negative.    Physical Exam Triage Vital Signs ED Triage Vitals [04/26/18 1513]  Enc Vitals Group     BP 119/85     Pulse Rate 98     Resp 18     Temp 97.9 F (36.6 C)     Temp Source Oral     SpO2 100 %     Weight      Height      Head Circumference      Peak Flow      Pain Score      Pain Loc      Pain Edu?      Excl. in [place]?    No data found.  Updated Vital Signs BP 119/85 (BP Location: Left Arm)   Pulse 98   Temp 97.9 F (36.6 C) (Oral)   Resp 18   SpO2 100%      Physical Exam  Constitutional: She is oriented to person, place, and time. She appears well-developed and well-nourished. No distress.  HENT:  Head: Normocephalic and atraumatic.  Right Ear: External ear normal.  Left Ear: External ear normal.  Nose: Nose normal.  Mouth/Throat: Oropharynx is clear and moist.  Eyes: Pupils are equal, round, and reactive to light. Conjunctivae are normal.  Neck: Normal range of motion.  Cardiovascular: Normal rate and regular rhythm.  Pulmonary/Chest: Effort normal and breath sounds normal. No respiratory distress. She has no wheezes. She has no rales.  Abdominal: Soft. Bowel sounds are normal. She exhibits no distension. There is no tenderness.  Musculoskeletal: Normal range of motion. She exhibits no edema.  Lymphadenopathy:    She has no cervical adenopathy.  Neurological: She is alert and oriented to person, place, and time.  Skin: Skin is warm and dry.  Psychiatric: She has a normal mood and affect. Her behavior is normal.     UC Treatments / Results  Labs (all labs ordered are listed, but only abnormal results are displayed) Labs Reviewed  POCT I-STAT, CHEM 8 - Abnormal; Notable for the following components:      Result Value   Glucose, Bld 68 (*)     All other components within normal limits    EKG None  Radiology No results found.  Procedures Procedures (including critical care time)  Medications Ordered in UC Medications - No data to display  Initial Impression / Assessment and Plan / UC Course  I have reviewed the triage vital signs and the nursing notes.  Pertinent labs & imaging results that were available during my care of the patient were reviewed by me and considered in my medical decision making (  see chart for details).    Greater than 50% of this visit was spent in counseling and coordinating care.  Total face to face time:  40 min.  Discussed differential diagnosis of presyncope.  Work-up.  Multiple visits to room during lab testing and EKG.  Discussed follow-up.  Discussed reactive hypo-glycemia and diet.   Final Clinical Impressions(s) / UC Diagnoses   Final diagnoses:  Pre-syncope  Low blood sugar reading   Discharge Instructions   None    ED Prescriptions    None     Controlled Substance Prescriptions [place] Controlled Substance Registry consulted? Not Applicable   Raylene Everts, MD 04/26/18 1612    Raylene Everts, MD 04/26/18 2031

## 2018-04-26 NOTE — Discharge Instructions (Signed)
I believe you are having low blood sugar. This is controlled mostly with diet. You need to eat several small meals during the day, with lots of protein. Avoid concentrated sweets, sweetened drinks like soda and limiting Follow-up with your primary care doctor

## 2018-04-26 NOTE — ED Triage Notes (Signed)
Pt was at work today and felt a sudden hot flash, weakness, felt faint. They gave her ice packs which helped. Pt states she doesn't feel like she is going to pass out. Pt states shes been spotting since the 23rd.

## 2018-04-26 NOTE — ED Notes (Signed)
Pt had hemoglobin checked at her job and it was 19.1

## 2018-04-29 DIAGNOSIS — Z131 Encounter for screening for diabetes mellitus: Secondary | ICD-10-CM | POA: Diagnosis not present

## 2018-04-29 DIAGNOSIS — R55 Syncope and collapse: Secondary | ICD-10-CM | POA: Diagnosis not present

## 2018-04-29 DIAGNOSIS — R319 Hematuria, unspecified: Secondary | ICD-10-CM | POA: Diagnosis not present

## 2018-04-29 DIAGNOSIS — D509 Iron deficiency anemia, unspecified: Secondary | ICD-10-CM | POA: Diagnosis not present

## 2018-04-29 DIAGNOSIS — R5383 Other fatigue: Secondary | ICD-10-CM | POA: Diagnosis not present

## 2018-04-29 DIAGNOSIS — N390 Urinary tract infection, site not specified: Secondary | ICD-10-CM | POA: Diagnosis not present

## 2018-05-06 DIAGNOSIS — D500 Iron deficiency anemia secondary to blood loss (chronic): Secondary | ICD-10-CM | POA: Diagnosis not present

## 2018-05-06 DIAGNOSIS — R55 Syncope and collapse: Secondary | ICD-10-CM | POA: Diagnosis not present

## 2018-05-24 DIAGNOSIS — N921 Excessive and frequent menstruation with irregular cycle: Secondary | ICD-10-CM | POA: Diagnosis not present

## 2018-07-15 DIAGNOSIS — R55 Syncope and collapse: Secondary | ICD-10-CM | POA: Diagnosis not present

## 2018-07-15 DIAGNOSIS — R5383 Other fatigue: Secondary | ICD-10-CM | POA: Diagnosis not present

## 2018-08-02 DIAGNOSIS — Z23 Encounter for immunization: Secondary | ICD-10-CM | POA: Diagnosis not present

## 2018-08-27 DIAGNOSIS — R5383 Other fatigue: Secondary | ICD-10-CM | POA: Diagnosis not present

## 2018-08-27 DIAGNOSIS — E890 Postprocedural hypothyroidism: Secondary | ICD-10-CM | POA: Diagnosis not present

## 2018-09-16 DIAGNOSIS — Z01411 Encounter for gynecological examination (general) (routine) with abnormal findings: Secondary | ICD-10-CM | POA: Diagnosis not present

## 2018-09-16 DIAGNOSIS — D259 Leiomyoma of uterus, unspecified: Secondary | ICD-10-CM | POA: Diagnosis not present

## 2018-10-03 DIAGNOSIS — R5383 Other fatigue: Secondary | ICD-10-CM | POA: Diagnosis not present

## 2018-10-03 DIAGNOSIS — E890 Postprocedural hypothyroidism: Secondary | ICD-10-CM | POA: Diagnosis not present

## 2018-10-06 ENCOUNTER — Ambulatory Visit (HOSPITAL_COMMUNITY)
Admission: EM | Admit: 2018-10-06 | Discharge: 2018-10-06 | Disposition: A | Discharge status: Home / Self Care | Payer: 59 | Attending: Internal Medicine | Admitting: Internal Medicine

## 2018-10-06 ENCOUNTER — Other Ambulatory Visit: Payer: Self-pay

## 2018-10-06 ENCOUNTER — Encounter (HOSPITAL_COMMUNITY): Payer: Self-pay | Admitting: *Deleted

## 2018-10-06 DIAGNOSIS — J029 Acute pharyngitis, unspecified: Secondary | ICD-10-CM | POA: Diagnosis present

## 2018-10-06 DIAGNOSIS — R05 Cough: Secondary | ICD-10-CM | POA: Diagnosis present

## 2018-10-06 DIAGNOSIS — E039 Hypothyroidism, unspecified: Secondary | ICD-10-CM | POA: Diagnosis not present

## 2018-10-06 DIAGNOSIS — J00 Acute nasopharyngitis [common cold]: Secondary | ICD-10-CM | POA: Diagnosis not present

## 2018-10-06 DIAGNOSIS — Z79899 Other long term (current) drug therapy: Secondary | ICD-10-CM | POA: Diagnosis not present

## 2018-10-06 DIAGNOSIS — Z79890 Hormone replacement therapy (postmenopausal): Secondary | ICD-10-CM | POA: Diagnosis not present

## 2018-10-06 LAB — POCT RAPID STREP A: Streptococcus, Group A Screen (Direct): NEGATIVE

## 2018-10-06 MED ORDER — IPRATROPIUM BROMIDE 0.06 % NA SOLN: 2.0000 | Freq: Four times a day (QID) | NASAL | 0 refills | Status: DC

## 2018-10-06 MED ORDER — LIDOCAINE VISCOUS HCL 2 % MT SOLN: OROMUCOSAL | 0 refills | Status: DC

## 2018-10-06 MED ORDER — FLUTICASONE PROPIONATE 50 MCG/ACT NA SUSP: 2.0000 | Freq: Every day | NASAL | 0 refills | Status: DC

## 2018-10-06 MED ORDER — PREDNISONE 50 MG PO TABS: 50.0000 mg | ORAL_TABLET | Freq: Every day | ORAL | 0 refills | Status: DC

## 2018-10-06 NOTE — ED Triage Notes (Addendum)
Reports starting with cough last night; has "a lot of phlegm in throat" and sore throat today.  Denies fevers.  Pt requesting check for strep.

## 2018-10-06 NOTE — ED Provider Notes (Signed)
[facility]    CSN: [id number] Arrival date & time: 10/06/18  1004     History   Chief Complaint Chief Complaint  Patient presents with  . Cough  . Sore Throat    HPI [name] is a 37 y.o. female.   37 year old female comes in for 2 day history of URI symptoms. Has had cough, rhinorrhea, nasal congestion, postnasal drip.  Denies fever, chills, night sweats.  Positive sick contact.  Never smoker.  Has not taken any medications for the symptoms.  Had her flu shot this year.  She states had antibiotics for a respiratory infection a few months ago, and cough never quite resolved. Denies chest pain, shortness of breath, wheezing.  Patient states, which is like to make sure it is not strep.     Past Medical History:  Diagnosis Date  . Anemia   . Fibroids   . History of blood transfusion    "related to fibroids"  . 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 Migraines    "last one was years ago" (05/20/2014)  . Thyroid disease     Patient Active Problem List   Diagnosis Date Noted  . Acute on chronic cholecystitis 05/20/2014  . Dysuria 05/20/2014  . Hypothyroidism (acquired) 05/20/2014  . Bilateral low back pain without sciatica 05/14/2014    Past Surgical History:  Procedure Laterality Date  . CHOLECYSTECTOMY N/A 05/20/2014   Procedure: LAPAROSCOPIC CHOLECYSTECTOMY WITH INTRAOPERATIVE CHOLANGIOGRAM;  Surgeon: Gayland Curry, MD;  Location: [facility];  Service: General;  Laterality: N/A;  . LAPAROSCOPIC CHOLECYSTECTOMY  05/20/2014  . MYOMECTOMY  04/2013    OB History   No obstetric history on file.      Home Medications    Prior to Admission medications   Medication Sig Start Date End Date Taking? Authorizing Provider  Fe Fum-FePoly-Vit C-Vit B3 (INTEGRA PO) Take by mouth.   Yes [provider]  levothyroxine (SYNTHROID, LEVOTHROID) 88 MCG tablet Take 175 mcg by mouth daily before breakfast.    Yes [provider]  norethindrone-ethinyl estradiol (VYFEMLA) 0.4-35 MG-MCG tablet Take 1 tablet by mouth daily.   Yes [provider]  fluticasone (FLONASE) 50 MCG/ACT nasal spray Place 2 sprays into both nostrils daily. 10/06/18   Tasia Catchings,  V, PA-C  ipratropium (ATROVENT) 0.06 % nasal spray Place 2 sprays into both nostrils 4 (four) times daily. 10/06/18   Tasia Catchings,  V, PA-C  lidocaine (XYLOCAINE) 2 % solution 5-15 mL gurgle as needed 10/06/18   Tasia Catchings,  V, PA-C  predniSONE (DELTASONE) 50 MG tablet Take 1 tablet (50 mg total) by mouth daily. 10/06/18   Ok Edwards, PA-C    Family History Family History  Problem Relation Age of Onset  . Lupus Mother   . Diabetes Father     Social History Social History   Tobacco Use  . Smoking status: Never Smoker  . Smokeless tobacco: Never Used  Substance Use Topics  . Alcohol use: Yes    Comment: social  . Drug use: No     Allergies   Patient has no known allergies.   Review of Systems Review of Systems  Reason unable to perform ROS: See HPI as above.     Physical Exam Triage Vital Signs ED Triage Vitals [10/06/18 1024]  Enc Vitals Group     BP (!) 136/91     Pulse Rate 82     Resp 16  Temp 98.3 F (36.8 C)     Temp Source Oral     SpO2 98 %     Weight      Height      Head Circumference      Peak Flow      Pain Score 3     Pain Loc      Pain Edu?      Excl. in [place]?    No data found.  Updated Vital Signs BP (!) 136/91 (BP Location: Left Arm)   Pulse 82   Temp 98.3 F (36.8 C) (Oral)   Resp 16   LMP  (LMP Unknown) Comment: irregular since June 2019; BCPs recently adjusted  SpO2 98%   Physical Exam Constitutional:      General: She is not in acute distress.    Appearance: She is well-developed. She is not ill-appearing, toxic-appearing or diaphoretic.  HENT:     Head: Normocephalic and atraumatic.     Right Ear: Tympanic membrane, ear canal and external ear normal. Tympanic membrane is not erythematous or  bulging.     Left Ear: Tympanic membrane, ear canal and external ear normal. Tympanic membrane is not erythematous or bulging.     Nose: Nose normal.     Right Sinus: No maxillary sinus tenderness or frontal sinus tenderness.     Left Sinus: No maxillary sinus tenderness or frontal sinus tenderness.     Mouth/Throat:     Pharynx: Uvula midline.     Tonsils: No tonsillar exudate.  Eyes:     Conjunctiva/sclera: Conjunctivae normal.     Pupils: Pupils are equal, round, and reactive to light.  Neck:     Musculoskeletal: Normal range of motion and neck supple.  Cardiovascular:     Rate and Rhythm: Normal rate and regular rhythm.     Heart sounds: Normal heart sounds. No murmur. No friction rub. No gallop.   Pulmonary:     Effort: Pulmonary effort is normal.     Breath sounds: Normal breath sounds. No decreased breath sounds, wheezing, rhonchi or rales.  Lymphadenopathy:     Cervical: No cervical adenopathy.  Skin:    General: Skin is warm and dry.  Neurological:     Mental Status: She is alert and oriented to person, place, and time.  Psychiatric:        Behavior: Behavior normal.        Judgment: Judgment normal.      UC Treatments / Results  Labs (all labs ordered are listed, but only abnormal results are displayed) Labs Reviewed  CULTURE, GROUP A STREP [facility])  POCT RAPID STREP A    EKG None  Radiology No results found.  Procedures Procedures (including critical care time)  Medications Ordered in UC Medications - No data to display  Initial Impression / Assessment and Plan / UC Course  I have reviewed the triage vital signs and the nursing notes.  Pertinent labs & imaging results that were available during my care of the patient were reviewed by me and considered in my medical decision making (see chart for details).    Discussed with patient history and exam most consistent with viral URI. Symptomatic treatment as needed. Push fluids. Return precautions given.     Final Clinical Impressions(s) / UC Diagnoses   Final diagnoses:  Acute nasopharyngitis    ED Prescriptions    Medication Sig Dispense Auth. Provider   predniSONE (DELTASONE) 50 MG tablet Take 1 tablet (50 mg total) by  mouth daily. 5 tablet ,  V, PA-C   fluticasone (FLONASE) 50 MCG/ACT nasal spray Place 2 sprays into both nostrils daily. 1 g ,  V, PA-C   ipratropium (ATROVENT) 0.06 % nasal spray Place 2 sprays into both nostrils 4 (four) times daily. 15 mL ,  V, PA-C   lidocaine (XYLOCAINE) 2 % solution 5-15 mL gurgle as needed 150 mL Tobin Chad, [place] 10/06/18 1055

## 2018-10-06 NOTE — Discharge Instructions (Signed)
Rapid strep negative. Symptoms are most likely due to viral illness/ drainage down your throat. Prednisone for cough. Start lidocaine for sore throat, do not eat or drink for the next 40 mins after use as it can stunt your gag reflex. Flonase, atrovent for nasal congestion/drainage. You can use over the counter nasal saline rinse such as neti pot for nasal congestion. Monitor for any worsening of symptoms, swelling of the throat, trouble breathing, trouble swallowing, leaning forward to breath, drooling, go to the emergency department for further evaluation needed.  For sore throat/cough try using a honey-based tea. Use 3 teaspoons of honey with juice squeezed from half lemon. Place shaved pieces of ginger into 1/2-1 cup of water and warm over stove top. Then mix the ingredients and repeat every 4 hours as needed.

## 2018-10-09 LAB — CULTURE, GROUP A STREP (THRC)

## 2019-02-25 ENCOUNTER — Encounter (HOSPITAL_COMMUNITY): Payer: Self-pay

## 2019-02-25 ENCOUNTER — Ambulatory Visit (HOSPITAL_COMMUNITY)
Admission: EM | Admit: 2019-02-25 | Discharge: 2019-02-25 | Disposition: A | Discharge status: Home / Self Care | Payer: 59 | Attending: Family Medicine | Admitting: Family Medicine

## 2019-02-25 ENCOUNTER — Other Ambulatory Visit: Payer: Self-pay

## 2019-02-25 DIAGNOSIS — R1030 Lower abdominal pain, unspecified: Secondary | ICD-10-CM | POA: Diagnosis not present

## 2019-02-25 LAB — POCT URINALYSIS DIP (DEVICE)
Bilirubin Urine: NEGATIVE
Glucose, UA: NEGATIVE mg/dL
Hgb urine dipstick: NEGATIVE
Ketones, ur: NEGATIVE mg/dL
Leukocytes,Ua: NEGATIVE
Nitrite: NEGATIVE
Protein, ur: NEGATIVE mg/dL
Specific Gravity, Urine: 1.025 (ref 1.005–1.030)
Urobilinogen, UA: 0.2 mg/dL (ref 0.0–1.0)
pH: 7 (ref 5.0–8.0)

## 2019-02-25 LAB — POCT PREGNANCY, URINE: Preg Test, Ur: NEGATIVE

## 2019-02-25 MED ORDER — NAPROXEN 500 MG PO TABS: 500.0000 mg | ORAL_TABLET | Freq: Two times a day (BID) | ORAL | 0 refills | Status: DC

## 2019-02-25 NOTE — ED Provider Notes (Signed)
[place]    CSN: [id number] Arrival date & time: 02/25/19  1618     History   Chief Complaint Chief Complaint  Patient presents with  . Urinary Tract Infection    HPI [name] is a 38 y.o. female history of fibroids, hypothyroidism, previous cholecystectomy presenting today for evaluation of lower abdominal pain.  Patient states that over the past 2 weeks she has had intermittent discomfort in her pelvic region.  She also has noted some irritation vaginally.  She is concerned about UTI although she denies dysuria, increased frequency or incomplete voiding.  She denies hematuria.  Early in the course of discomfort she had taken Tylenol and ibuprofen which helped with her symptoms, but over the past couple of days have returned and worsened.  Today not responding to ibuprofen.  Describes a cramping sensation in her vaginal area but a sharper pain-like sensation in her lower abdomen.  Mild nausea, denies vomiting.  Bowel movements relatively normal, has felt slightly constipated but has had a bowel movement today and yesterday.  Denies blood in the stool.  Denies abnormal but discharge.  Denies any new partners or concerns for STDs.  Last menstrual cycle 4/16.  Recently came off oral contraceptives around this time as well.  She does note she has a history of fibroids, had previous myomectomy in 2014.  Denies any respiratory symptoms of congestion, cough or sore throat.  Denies fevers, chills or body aches.  HPI  Past Medical History:  Diagnosis Date  . Anemia   . Fibroids   . History of blood transfusion    "related to fibroids"  . 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 Migraines    "last one was years ago" (05/20/2014)  . Thyroid disease     Patient Active Problem List   Diagnosis Date Noted  . Acute on chronic cholecystitis 05/20/2014  . Dysuria 05/20/2014  . Hypothyroidism (acquired) 05/20/2014  . Bilateral low back pain without  sciatica 05/14/2014    Past Surgical History:  Procedure Laterality Date  . CHOLECYSTECTOMY N/A 05/20/2014   Procedure: LAPAROSCOPIC CHOLECYSTECTOMY WITH INTRAOPERATIVE CHOLANGIOGRAM;  Surgeon: Gayland Curry, MD;  Location: [facility];  Service: General;  Laterality: N/A;  . LAPAROSCOPIC CHOLECYSTECTOMY  05/20/2014  . MYOMECTOMY  04/2013    OB History   No obstetric history on file.      Home Medications    Prior to Admission medications   Medication Sig Start Date End Date Taking? Authorizing Provider  Fe Fum-FePoly-Vit C-Vit B3 (INTEGRA PO) Take by mouth.    [provider]  fluticasone (FLONASE) 50 MCG/ACT nasal spray Place 2 sprays into both nostrils daily. 10/06/18   Tasia Catchings, Amy V, PA-C  ipratropium (ATROVENT) 0.06 % nasal spray Place 2 sprays into both nostrils 4 (four) times daily. 10/06/18   Tasia Catchings, Amy V, PA-C  levothyroxine (SYNTHROID, LEVOTHROID) 88 MCG tablet Take 175 mcg by mouth daily before breakfast.     [provider]  lidocaine (XYLOCAINE) 2 % solution 5-15 mL gurgle as needed 10/06/18   Tasia Catchings, Amy V, PA-C  naproxen (NAPROSYN) 500 MG tablet Take 1 tablet (500 mg total) by mouth 2 (two) times daily. 02/25/19   Wieters, Hallie C, PA-C  norethindrone-ethinyl estradiol (VYFEMLA) 0.4-35 MG-MCG tablet Take 1 tablet by mouth daily.    [provider]  predniSONE (DELTASONE) 50 MG tablet Take 1 tablet (50 mg total) by mouth daily. 10/06/18   Ok Edwards,  PA-C    Family History Family History  Problem Relation Age of Onset  . Lupus Mother   . Diabetes Father     Social History Social History   Tobacco Use  . Smoking status: Never Smoker  . Smokeless tobacco: Never Used  Substance Use Topics  . Alcohol use: Yes    Comment: social  . Drug use: No     Allergies   Patient has no known allergies.   Review of Systems Review of Systems  Constitutional: Negative for fever.  HENT: Negative for congestion, rhinorrhea and sore throat.   Respiratory:  Negative for cough and shortness of breath.   Cardiovascular: Negative for chest pain.  Gastrointestinal: Positive for abdominal pain and nausea. Negative for diarrhea and vomiting.  Genitourinary: Positive for pelvic pain. Negative for dysuria, flank pain, genital sores, hematuria, menstrual problem, vaginal bleeding, vaginal discharge and vaginal pain.  Musculoskeletal: Negative for back pain.  Skin: Negative for rash.  Neurological: Negative for dizziness, light-headedness and headaches.     Physical Exam Triage Vital Signs ED Triage Vitals  Enc Vitals Group     BP 02/25/19 1649 (!) 124/92     Pulse Rate 02/25/19 1649 (!) 109     Resp 02/25/19 1649 18     Temp 02/25/19 1649 99.9 F (37.7 C)     Temp Source 02/25/19 1649 Oral     SpO2 02/25/19 1649 100 %     Weight --      Height --      Head Circumference --      Peak Flow --      Pain Score 02/25/19 1648 7     Pain Loc --      Pain Edu? --      Excl. in [place]? --    No data found.  Updated Vital Signs BP (!) 124/92 (BP Location: Left Arm)   Pulse (!) 109   Temp 99.9 F (37.7 C) (Oral)   Resp 18   LMP 01/30/2019 (Exact Date)   SpO2 100%   Visual Acuity Right Eye Distance:   Left Eye Distance:   Bilateral Distance:    Right Eye Near:   Left Eye Near:    Bilateral Near:     Physical Exam Vitals signs and nursing note reviewed.  Constitutional:      General: She is not in acute distress.    Appearance: She is well-developed.  HENT:     Head: Normocephalic and atraumatic.  Eyes:     Conjunctiva/sclera: Conjunctivae normal.     Comments: Oral mucosa pink and moist, no tonsillar enlargement or exudate. Posterior pharynx patent and nonerythematous, no uvula deviation or swelling. Normal phonation.  Neck:     Musculoskeletal: Neck supple.  Cardiovascular:     Rate and Rhythm: Regular rhythm. Tachycardia present.     Heart sounds: No murmur.  Pulmonary:     Effort: Pulmonary effort is normal. No respiratory  distress.     Breath sounds: Normal breath sounds.     Comments: Breathing comfortably at rest, CTABL, no wheezing, rales or other adventitious sounds auscultated Abdominal:     Palpations: Abdomen is soft.     Tenderness: There is abdominal tenderness.     Comments: Tender to bilateral lower quadrants, increased tenderness on the right side, negative McBurney's, negative rebound, negative Rovsing tenderness over suprapubic area as well, lower abdomen does feel slightly tense, but nondistended, upper abdomen is soft No CVA tenderness  Genitourinary:  Comments: Normal external female genitalia, vaginal mucosa pink, cervix without erythema, mild discomfort with palpation of cervix on bimanual, but did not reproduce symptoms Skin:    General: Skin is warm and dry.  Neurological:     General: No focal deficit present.     Mental Status: She is alert and oriented to person, place, and time.      UC Treatments / Results  Labs (all labs ordered are listed, but only abnormal results are displayed) Labs Reviewed  POC URINE PREG, ED  POCT URINALYSIS DIP (DEVICE)  POCT PREGNANCY, URINE  CERVICOVAGINAL ANCILLARY ONLY    EKG None  Radiology No results found.  Procedures Procedures (including critical care time)  Medications Ordered in UC Medications - No data to display  Initial Impression / Assessment and Plan / UC Course  I have reviewed the triage vital signs and the nursing notes.  Pertinent labs & imaging results that were available during my care of the patient were reviewed by me and considered in my medical decision making (see chart for details).     Patient with low-grade fever and tachycardia today which was incidental finding.  Patient had no reported fevers and fever checked earlier today was normal.  UA completely unremarkable, pregnancy negative.  Vaginal exam nonsuggestive of STD/PID/cervicitis.  Will defer any antibiotic treatment for this for now.  Vaginal swab  obtained to check for these causes, will call with results and provide treatment as needed.  Abdominal exam and history/length of symptoms not suggestive of appendicitis or underlying abdominal emergency.  Will continue to monitor, follow-up with OB/GYN for further evaluation of pelvic discomfort/fibroids.  Advise given new onset of low-grade fever, consider evaluation for COVID- provided contact for [facility] de[facility] to see if she would meet their criteria. Anti-inflammatory/Naprosyn as needed for discomfort in the interim.  Follow-up in emergency room if developing worsening abdominal pain or symptoms.Discussed strict return precautions. Patient verbalized understanding and is agreeable with plan.  Final Clinical Impressions(s) / UC Diagnoses   Final diagnoses:  Lower abdominal pain     Discharge Instructions     Please follow up with [facility] for COVID testing since you have a fever [phone]- appointment only We will call with results of swab in a few days.  Tylenol for fever/discomfort  Please follow up with OBGYN for further evaluation of pelvic pain/fibroids Follow up in emergency room if symptoms worsening      ED Prescriptions    Medication Sig Dispense Auth. Provider   naproxen (NAPROSYN) 500 MG tablet Take 1 tablet (500 mg total) by mouth 2 (two) times daily. 30 tablet Wieters, [place] C, PA-C     Controlled Substance Prescriptions [place] Controlled Substance Registry consulted? Not Applicable   Janith Lima, [place] 02/25/19 1812

## 2019-02-25 NOTE — Discharge Instructions (Addendum)
Please follow up with [facility] for COVID testing since you have a fever [phone]- appointment only We will call with results of swab in a few days.  Tylenol for fever/discomfort  Please follow up with OBGYN for further evaluation of pelvic pain/fibroids Follow up in emergency room if symptoms worsening

## 2019-02-25 NOTE — ED Triage Notes (Signed)
Patient presents to Urgent Care with complaints of UTI symptoms since 2 weeks ago. Patient reports intermittent lower abdominal pain and lower back pain last week, pt denies frequent or burning urination.

## 2019-02-26 LAB — CERVICOVAGINAL ANCILLARY ONLY
Bacterial vaginitis: POSITIVE — AB
Candida vaginitis: NEGATIVE
Chlamydia: NEGATIVE
Neisseria Gonorrhea: NEGATIVE
Trichomonas: NEGATIVE

## 2019-02-27 ENCOUNTER — Telehealth (HOSPITAL_COMMUNITY): Payer: Self-pay | Admitting: Emergency Medicine

## 2019-02-27 MED ORDER — METRONIDAZOLE 500 MG PO TABS: 500.0000 mg | ORAL_TABLET | Freq: Two times a day (BID) | ORAL | 0 refills | Status: AC

## 2019-02-27 NOTE — Telephone Encounter (Signed)
Bacterial vaginosis is positive. This was not treated at the urgent care visit.  Flagyl 500 mg BID x 7 days #14 no refills sent to patients pharmacy of choice.    Patient contacted and made aware of all results, all questions answered.

## 2020-03-26 ENCOUNTER — Emergency Department (HOSPITAL_COMMUNITY)
Admission: EM | Admit: 2020-03-26 | Discharge: 2020-03-27 | Disposition: A | Discharge status: Home / Self Care | Payer: 59 | Attending: Emergency Medicine | Admitting: Emergency Medicine

## 2020-03-26 ENCOUNTER — Other Ambulatory Visit: Payer: Self-pay

## 2020-03-26 DIAGNOSIS — E039 Hypothyroidism, unspecified: Secondary | ICD-10-CM | POA: Diagnosis not present

## 2020-03-26 DIAGNOSIS — Z79899 Other long term (current) drug therapy: Secondary | ICD-10-CM | POA: Insufficient documentation

## 2020-03-26 DIAGNOSIS — D649 Anemia, unspecified: Secondary | ICD-10-CM | POA: Diagnosis not present

## 2020-03-26 DIAGNOSIS — R7989 Other specified abnormal findings of blood chemistry: Secondary | ICD-10-CM | POA: Diagnosis present

## 2020-03-26 LAB — COMPREHENSIVE METABOLIC PANEL
ALT: 81 U/L — ABNORMAL HIGH (ref 0–44)
AST: 111 U/L — ABNORMAL HIGH (ref 15–41)
Albumin: 2.9 g/dL — ABNORMAL LOW (ref 3.5–5.0)
Alkaline Phosphatase: 69 U/L (ref 38–126)
Anion gap: 12 (ref 5–15)
BUN: 11 mg/dL (ref 6–20)
CO2: 22 mmol/L (ref 22–32)
Calcium: 8.9 mg/dL (ref 8.9–10.3)
Chloride: 104 mmol/L (ref 98–111)
Creatinine, Ser: 0.88 mg/dL (ref 0.44–1.00)
GFR calc Af Amer: >60 mL/min (ref >60)
GFR calc non Af Amer: >60 mL/min (ref >60)
Glucose, Bld: 85 mg/dL (ref 70–99)
Potassium: 3.7 mmol/L (ref 3.5–5.1)
Sodium: 138 mmol/L (ref 135–145)
Total Bilirubin: 0.2 mg/dL — ABNORMAL LOW (ref 0.3–1.2)
Total Protein: 8.5 g/dL — ABNORMAL HIGH (ref 6.5–8.1)

## 2020-03-26 LAB — ABO/RH: ABO/RH(D): B POS

## 2020-03-26 LAB — CBC
HCT: 28.5 % — ABNORMAL LOW (ref 36.0–46.0)
Hemoglobin: 8.3 g/dL — ABNORMAL LOW (ref 12.0–15.0)
MCH: 19.8 pg — ABNORMAL LOW (ref 26.0–34.0)
MCHC: 29.1 g/dL — ABNORMAL LOW (ref 30.0–36.0)
MCV: 67.9 fL — ABNORMAL LOW (ref 80.0–100.0)
Platelets: 390 10*3/uL (ref 150–400)
RBC: 4.2 MIL/uL (ref 3.87–5.11)
RDW: 20.9 % — ABNORMAL HIGH (ref 11.5–15.5)
WBC: 8.8 10*3/uL (ref 4.0–10.5)
nRBC: 0 % (ref 0.0–0.2)

## 2020-03-26 LAB — TYPE AND SCREEN
ABO/RH(D): B POS
Antibody Screen: NEGATIVE

## 2020-03-26 LAB — I-STAT BETA HCG BLOOD, ED (MC, WL, AP ONLY): I-stat hCG, quantitative: <5 m[IU]/mL (ref <5)

## 2020-03-26 NOTE — ED Triage Notes (Signed)
Pt here for blood transfusion, sent by endocrinologist for hgb 7.9. Endorses abdominal cramping and heavier than normal vaginal bleeding. Hx fibroids.

## 2020-03-27 MED ORDER — FERROUS SULFATE 325 (65 FE) MG PO TABS: 325.0000 mg | ORAL_TABLET | Freq: Every day | ORAL | 0 refills | Status: DC

## 2020-03-27 NOTE — ED Notes (Signed)
The pt has a history of a low hgb   She has had her period x 2 this month and is currently on her period.  No pain

## 2020-03-27 NOTE — ED Provider Notes (Signed)
[facility] EMERGENCY DEPARTMENT Provider Note   CSN: [id number] Arrival date & time: 03/26/20  1802     History Chief Complaint  Patient presents with  . Abnormal Lab    [name] is a 39 y.o. female with a history of anemia previous transfusions, fibroids, migraines, & hypothyroidism who presents to the ED per instruction of her endocrinologist for transfusion due to anemia.  Patient states she overall feels okay, she was seen at her endocrinology office for monitoring of her thyroid function, she asked that her blood counts be checked, she was called and told to come to the emergency department for transfusion as her hemoglobin was 7.9.  She states that she occasionally feels fatigued, no acute change.  She has a history of fibroids and heavy menstrual cycles, she is currently on her period, she is on an OCP, she previously took iron but stopped this last year as her iron levels were okay.  She denies chest pain, dyspnea, dizziness, or syncope.  HPI     Past Medical History:  Diagnosis Date  . Anemia   . Fibroids   . History of blood transfusion    "related to fibroids"  . 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 Migraines    "last one was years ago" (05/20/2014)  . Thyroid disease     Patient Active Problem List   Diagnosis Date Noted  . Acute on chronic cholecystitis 05/20/2014  . Dysuria 05/20/2014  . Hypothyroidism (acquired) 05/20/2014  . Bilateral low back pain without sciatica 05/14/2014    Past Surgical History:  Procedure Laterality Date  . CHOLECYSTECTOMY N/A 05/20/2014   Procedure: LAPAROSCOPIC CHOLECYSTECTOMY WITH INTRAOPERATIVE CHOLANGIOGRAM;  Surgeon: Gayland Curry, MD;  Location: [place];  Service: General;  Laterality: N/A;  . LAPAROSCOPIC CHOLECYSTECTOMY  05/20/2014  . MYOMECTOMY  04/2013     OB History   No obstetric history on file.     Family History  Problem Relation Age of Onset  . Lupus Mother     . Diabetes Father     Social History   Tobacco Use  . Smoking status: Never Smoker  . Smokeless tobacco: Never Used  Vaping Use  . Vaping Use: Never used  Substance Use Topics  . Alcohol use: Yes    Comment: social  . Drug use: No    Home Medications Prior to Admission medications   Medication Sig Start Date End Date Taking? Authorizing Provider  Fe Fum-FePoly-Vit C-Vit B3 (INTEGRA PO) Take by mouth.    [provider]  fluticasone (FLONASE) 50 MCG/ACT nasal spray Place 2 sprays into both nostrils daily. 10/06/18   Tasia Catchings, Amy V, PA-C  ipratropium (ATROVENT) 0.06 % nasal spray Place 2 sprays into both nostrils 4 (four) times daily. 10/06/18   Tasia Catchings, Amy V, PA-C  levothyroxine (SYNTHROID, LEVOTHROID) 88 MCG tablet Take 175 mcg by mouth daily before breakfast.     [provider]  lidocaine (XYLOCAINE) 2 % solution 5-15 mL gurgle as needed 10/06/18   Tasia Catchings, Amy V, PA-C  naproxen (NAPROSYN) 500 MG tablet Take 1 tablet (500 mg total) by mouth 2 (two) times daily. 02/25/19   Wieters, Hallie C, PA-C  norethindrone-ethinyl estradiol (VYFEMLA) 0.4-35 MG-MCG tablet Take 1 tablet by mouth daily.    [provider]  predniSONE (DELTASONE) 50 MG tablet Take 1 tablet (50 mg total) by mouth daily. 10/06/18   Ok Edwards, PA-C    Allergies  Patient has no known allergies.  Review of Systems   Review of Systems  Constitutional: Positive for fatigue. Negative for chills and fever.  Respiratory: Negative for shortness of breath.   Cardiovascular: Negative for chest pain.  Gastrointestinal: Negative for abdominal pain, nausea and vomiting.  Genitourinary: Positive for vaginal bleeding. Negative for vaginal discharge.  Neurological: Negative for dizziness and syncope.  All other systems reviewed and are negative.   Physical Exam Updated Vital Signs BP 121/84   Pulse 87   Temp 97.9 F (36.6 C)   Resp 14   SpO2 100%   Physical Exam Vitals and nursing note reviewed.   Constitutional:      General: She is not in acute distress.    Appearance: She is well-developed. She is not toxic-appearing.  HENT:     Head: Normocephalic and atraumatic.  Eyes:     General:        Right eye: No discharge.        Left eye: No discharge.     Conjunctiva/sclera: Conjunctivae normal.  Cardiovascular:     Rate and Rhythm: Normal rate and regular rhythm.  Pulmonary:     Effort: Pulmonary effort is normal. No respiratory distress.     Breath sounds: Normal breath sounds. No wheezing, rhonchi or rales.  Abdominal:     General: There is no distension.     Palpations: Abdomen is soft.     Tenderness: There is no abdominal tenderness.  Musculoskeletal:     Cervical back: Neck supple.  Skin:    General: Skin is warm and dry.     Findings: No rash.  Neurological:     Mental Status: She is alert.     Comments: Clear speech.   Psychiatric:        Behavior: Behavior normal.    ED Results / Procedures / Treatments   Labs (all labs ordered are listed, but only abnormal results are displayed) Labs Reviewed  COMPREHENSIVE METABOLIC PANEL - Abnormal; Notable for the following components:      Result Value   Total Protein 8.5 (*)    Albumin 2.9 (*)    AST 111 (*)    ALT 81 (*)    Total Bilirubin 0.2 (*)    All other components within normal limits  CBC - Abnormal; Notable for the following components:   Hemoglobin 8.3 (*)    HCT 28.5 (*)    MCV 67.9 (*)    MCH 19.8 (*)    MCHC 29.1 (*)    RDW 20.9 (*)    All other components within normal limits  I-STAT BETA HCG BLOOD, ED ([facility], [facility], [facility] ONLY)  TYPE AND SCREEN  ABO/RH    EKG None  Radiology No results found.  Procedures Procedures (including critical care time)  Medications Ordered in ED Medications - No data to display  ED Course  I have reviewed the triage vital signs and the nursing notes.  Pertinent labs & imaging results that were available during my care of the patient were reviewed by me and  considered in my medical decision making (see chart for details).    MDM Rules/Calculators/A&P                         Patient presents to the ED for possible transfusion due to anemia found on outpatient labs.  Patient is relatively asymptomatic from her baseline.  She is nontoxic, resting comfortably, vitals within normal limits.  She has benign physical exam. Additional history obtained:  Additional history obtained from chart review and nursing note reviewed. Lab Tests:  I reviewed, and interpreted labs, which included:  CBC: Anemia with hemoglobin 8.3 and hematocrit of 28.5.  No leukocytosis. CMP: Hypoalbuminemia, mildly elevated LFTs which are improved from prior. Pregnancy test: Negative. ED Course:  Patient with anemia likely secondary to her known heavy menstrual cycle and fibroids, currently menstruating. She relays that she recently switched brands of OCP at the turn of th year and has had heavier periods since then. Hgb/hcb 8.3 & 28.5, does not require emergent transfusion as hgb is not < 7 and she has normal vital signs with relatively minimal sxs. Will restart iron, continue OCP, follow up with PCP/OBGYN with strict return precautions. I discussed results, treatment plan, need for follow-up, and return precautions with the patient. Provided opportunity for questions, patient confirmed understanding and is in agreement with plan.   Findings and plan of care discussed with supervising physician Dr. Leonides Schanz who is in agreement.   Portions of this note were generated with [employer]. Dictation errors may occur despite best attempts at proofreading.   Final Clinical Impression(s) / ED Diagnoses Final diagnoses:  Anemia, unspecified type    Rx / DC Orders ED Discharge Orders         Ordered    ferrous sulfate 325 (65 FE) MG tablet  Daily     Discontinue  Reprint     03/27/20 0157           Amaryllis Dyke, PA-C 03/27/20 0159    Ward, Delice Bison,  DO 03/27/20 0250

## 2020-03-27 NOTE — Discharge Instructions (Signed)
You were seen in the emergency department today for possible blood transfusion.  Your hemoglobin is 8.3 here therefore we do not feel you require an emergent blood transfusion at this time.  We are restarting you on your iron supplement to help with this.  Please continue your birth control.  Please follow-up with your primary care provider and/or OB/GYN within 3 days for a recheck of your symptoms as well as your blood work.  Return to the ER for new or worsening symptoms including but not limited to lightheadedness, dizziness, passing out, chest pain, trouble breathing, heavier bleeding, or any other concerns.

## 2020-04-27 ENCOUNTER — Emergency Department (HOSPITAL_COMMUNITY)
Admission: EM | Admit: 2020-04-27 | Discharge: 2020-04-28 | Disposition: A | Discharge status: Home / Self Care | Payer: 59 | Attending: Emergency Medicine | Admitting: Emergency Medicine

## 2020-04-27 ENCOUNTER — Encounter (HOSPITAL_COMMUNITY): Payer: Self-pay | Admitting: Emergency Medicine

## 2020-04-27 ENCOUNTER — Other Ambulatory Visit: Payer: Self-pay

## 2020-04-27 ENCOUNTER — Ambulatory Visit (INDEPENDENT_AMBULATORY_CARE_PROVIDER_SITE_OTHER)
Admission: EM | Admit: 2020-04-27 | Discharge: 2020-04-27 | Disposition: A | Discharge status: Home / Self Care | Payer: 59 | Source: Home / Self Care

## 2020-04-27 DIAGNOSIS — E039 Hypothyroidism, unspecified: Secondary | ICD-10-CM | POA: Insufficient documentation

## 2020-04-27 DIAGNOSIS — N12 Tubulo-interstitial nephritis, not specified as acute or chronic: Secondary | ICD-10-CM

## 2020-04-27 DIAGNOSIS — R000 Tachycardia, unspecified: Secondary | ICD-10-CM

## 2020-04-27 DIAGNOSIS — N390 Urinary tract infection, site not specified: Secondary | ICD-10-CM | POA: Diagnosis not present

## 2020-04-27 DIAGNOSIS — Z79890 Hormone replacement therapy (postmenopausal): Secondary | ICD-10-CM | POA: Insufficient documentation

## 2020-04-27 DIAGNOSIS — R109 Unspecified abdominal pain: Secondary | ICD-10-CM | POA: Diagnosis present

## 2020-04-27 LAB — CBC WITH DIFFERENTIAL/PLATELET
Abs Immature Granulocytes: 0.06 10*3/uL (ref 0.00–0.07)
Basophils Absolute: 0 10*3/uL (ref 0.0–0.1)
Basophils Relative: 0 %
Eosinophils Absolute: 0 10*3/uL (ref 0.0–0.5)
Eosinophils Relative: 0 %
HCT: 29 % — ABNORMAL LOW (ref 36.0–46.0)
Hemoglobin: 8.7 g/dL — ABNORMAL LOW (ref 12.0–15.0)
Immature Granulocytes: 1 %
Lymphocytes Relative: 9 %
Lymphs Abs: 1.1 10*3/uL (ref 0.7–4.0)
MCH: 21 pg — ABNORMAL LOW (ref 26.0–34.0)
MCHC: 30 g/dL (ref 30.0–36.0)
MCV: 69.9 fL — ABNORMAL LOW (ref 80.0–100.0)
Monocytes Absolute: 0.4 10*3/uL (ref 0.1–1.0)
Monocytes Relative: 3 %
Neutro Abs: 10.5 10*3/uL — ABNORMAL HIGH (ref 1.7–7.7)
Neutrophils Relative %: 87 %
Platelets: 326 10*3/uL (ref 150–400)
RBC: 4.15 MIL/uL (ref 3.87–5.11)
RDW: 24.5 % — ABNORMAL HIGH (ref 11.5–15.5)
WBC: 12 10*3/uL — ABNORMAL HIGH (ref 4.0–10.5)
nRBC: 0 % (ref 0.0–0.2)

## 2020-04-27 LAB — URINALYSIS, ROUTINE W REFLEX MICROSCOPIC
Bilirubin Urine: NEGATIVE
Glucose, UA: NEGATIVE mg/dL
Ketones, ur: NEGATIVE mg/dL
Nitrite: NEGATIVE
Protein, ur: NEGATIVE mg/dL
Specific Gravity, Urine: 1.005 (ref 1.005–1.030)
pH: 6 (ref 5.0–8.0)

## 2020-04-27 LAB — COMPREHENSIVE METABOLIC PANEL
ALT: 77 U/L — ABNORMAL HIGH (ref 0–44)
AST: 73 U/L — ABNORMAL HIGH (ref 15–41)
Albumin: 3.4 g/dL — ABNORMAL LOW (ref 3.5–5.0)
Alkaline Phosphatase: 66 U/L (ref 38–126)
Anion gap: 11 (ref 5–15)
BUN: 11 mg/dL (ref 6–20)
CO2: 24 mmol/L (ref 22–32)
Calcium: 9.7 mg/dL (ref 8.9–10.3)
Chloride: 100 mmol/L (ref 98–111)
Creatinine, Ser: 0.82 mg/dL (ref 0.44–1.00)
GFR calc Af Amer: >60 mL/min (ref >60)
GFR calc non Af Amer: >60 mL/min (ref >60)
Glucose, Bld: 103 mg/dL — ABNORMAL HIGH (ref 70–99)
Potassium: 3.7 mmol/L (ref 3.5–5.1)
Sodium: 135 mmol/L (ref 135–145)
Total Bilirubin: 0.5 mg/dL (ref 0.3–1.2)
Total Protein: 8.2 g/dL — ABNORMAL HIGH (ref 6.5–8.1)

## 2020-04-27 LAB — POCT URINALYSIS DIP (DEVICE)
Bilirubin Urine: NEGATIVE
Glucose, UA: NEGATIVE mg/dL
Ketones, ur: NEGATIVE mg/dL
Nitrite: POSITIVE — AB
Protein, ur: 30 mg/dL — AB
Specific Gravity, Urine: 1.02 (ref 1.005–1.030)
Urobilinogen, UA: 0.2 mg/dL (ref 0.0–1.0)
pH: 7 (ref 5.0–8.0)

## 2020-04-27 MED ORDER — ACETAMINOPHEN 325 MG PO TABS
650.0000 mg | ORAL_TABLET | Freq: Once | ORAL | Status: AC
  Administered 2020-04-27: 650 mg via ORAL

## 2020-04-27 MED ORDER — ACETAMINOPHEN 325 MG PO TABS
ORAL_TABLET | ORAL | Status: AC
  Filled 2020-04-27: qty 2

## 2020-04-27 NOTE — Discharge Instructions (Signed)
Given your symptoms, high fever and heart rate you need to be further evaluated at a higher level of care tonight in the Emergency Department. It is very important you get this full work up [employer].

## 2020-04-27 NOTE — ED Triage Notes (Signed)
Pt c/o 9/10 right flank pain for the past few days with fever, pt denies any urinary symptoms.

## 2020-04-27 NOTE — ED Notes (Signed)
Patient is being discharged from the Urgent Care and sent to the Emergency Department via POV. Per Vernon Prey, PA patient is in need of higher level of care due to pyleonephritis Patient is aware and verbalizes understanding of plan of care.  Vitals:   04/27/20 2003  BP: 139/69  Pulse: (!) 133  Resp: 18  Temp: (!) 103.1 F (39.5 C)  SpO2: 100%

## 2020-04-27 NOTE — ED Provider Notes (Signed)
[place]    CSN: [id number] Arrival date & time: 04/27/20  1904      History   Chief Complaint Chief Complaint  Patient presents with  . Urinary Tract Infection    HPI [name] is a 39 y.o. female.   Patient presents urgent care for 2-3-day history of flank pain and chills..  She reports symptoms started around Sunday.  She has had some increased frequency.  However mainly she has had left flank pain radiating to her back.  She reports this is up to a 7 out of 10 at times.  This pain can come and go with baseline pain but increases.  She does report she has developed some right-sided flank pain as well throughout today.  She reports this pain gets worse with movement.  He has had some chills.  She has had some urinary urgency, however not much painful urination.  Denies blood in her urine.  She does endorse cloudy urine.  Endorses strong urine odor.  No measured fever at home however she has felt hot.  She does report she does not feel well.  She also reports some left upper abdominal pain.  She has not had vomiting.  Is been several days since she has had a bowel movement.  No diarrhea.  Denies vaginal discharge.  Endorses vaginal bleeding as she is currently on her cycle.  Reports she had uterine procedure 10 days ago.  She reports she has been taking iron supplements for anemia diagnosed in June.     Past Medical History:  Diagnosis Date  . Anemia   . Fibroids   . History of blood transfusion    "related to fibroids"  . 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 Migraines    "last one was years ago" (05/20/2014)  . Thyroid disease     Patient Active Problem List   Diagnosis Date Noted  . Acute on chronic cholecystitis 05/20/2014  . Dysuria 05/20/2014  . Hypothyroidism (acquired) 05/20/2014  . Bilateral low back pain without sciatica 05/14/2014    Past Surgical History:  Procedure Laterality Date  . CHOLECYSTECTOMY N/A  05/20/2014   Procedure: LAPAROSCOPIC CHOLECYSTECTOMY WITH INTRAOPERATIVE CHOLANGIOGRAM;  Surgeon: Gayland Curry, MD;  Location: [place];  Service: General;  Laterality: N/A;  . LAPAROSCOPIC CHOLECYSTECTOMY  05/20/2014  . MYOMECTOMY  04/2013    OB History   No obstetric history on file.      Home Medications    Prior to Admission medications   Medication Sig Start Date End Date Taking? Authorizing Provider  ferrous sulfate 325 (65 FE) MG tablet Take 1 tablet (325 mg total) by mouth daily. 03/27/20  Yes Petrucelli, Glynda Jaeger, PA-C  levothyroxine (SYNTHROID) 175 MCG tablet Take 175 mcg by mouth daily. 03/26/20  Yes [provider]  naproxen sodium (ALEVE) 220 MG tablet Take 220 mg by mouth.   Yes [provider]  YASMIN 28 3-0.03 MG tablet Take 1 tablet by mouth daily. 03/26/20   [provider]  fluticasone (FLONASE) 50 MCG/ACT nasal spray Place 2 sprays into both nostrils daily. Patient not taking: Reported on 03/27/2020 10/06/18 03/27/20  Ok Edwards, PA-C  ipratropium (ATROVENT) 0.06 % nasal spray Place 2 sprays into both nostrils 4 (four) times daily. Patient not taking: Reported on 03/27/2020 10/06/18 03/27/20  Arturo Morton    Family History Family History  Problem Relation Age of Onset  . Lupus Mother   .  Diabetes Father     Social History Social History   Tobacco Use  . Smoking status: Never Smoker  . Smokeless tobacco: Never Used  Vaping Use  . Vaping Use: Never used  Substance Use Topics  . Alcohol use: Yes    Comment: social  . Drug use: No     Allergies   Patient has no known allergies.   Review of Systems Review of Systems   Physical Exam Triage Vital Signs ED Triage Vitals  Enc Vitals Group     BP      Pulse      Resp      Temp      Temp src      SpO2      Weight      Height      Head Circumference      Peak Flow      Pain Score      Pain Loc      Pain Edu?      Excl. in [place]?    No data found.  Updated Vital  Signs BP 139/69 (BP Location: Left Arm)   Pulse (!) 133   Temp (!) 103.1 F (39.5 C) (Oral)   Resp 18   LMP 04/19/2020   SpO2 100%   Visual Acuity Right Eye Distance:   Left Eye Distance:   Bilateral Distance:    Right Eye Near:   Left Eye Near:    Bilateral Near:     Physical Exam Vitals and nursing note reviewed.  Constitutional:      General: She is not in acute distress.    Appearance: She is well-developed. She is ill-appearing.     Comments: Patient warm to the touch and appear slightly pale.  HENT:     Head: Normocephalic and atraumatic.  Eyes:     Conjunctiva/sclera: Conjunctivae normal.  Cardiovascular:     Rate and Rhythm: Regular rhythm. Tachycardia present.     Heart sounds: No murmur heard.   Pulmonary:     Effort: Pulmonary effort is normal. No respiratory distress.     Breath sounds: Normal breath sounds.  Abdominal:     Palpations: Abdomen is soft.     Tenderness: There is abdominal tenderness (Left upper flank tenderness and abdominal pain.  No rebound.). There is right CVA tenderness and left CVA tenderness.  Skin:    General: Skin is warm and dry.  Neurological:     Mental Status: She is alert.      UC Treatments / Results  Labs (all labs ordered are listed, but only abnormal results are displayed) Labs Reviewed  POCT URINALYSIS DIP (DEVICE) - Abnormal; Notable for the following components:      Result Value   Hgb urine dipstick MODERATE (*)    Protein, ur 30 (*)    Nitrite POSITIVE (*)    Leukocytes,Ua SMALL (*)    All other components within normal limits  URINE CULTURE    EKG   Radiology No results found.  Procedures Procedures (including critical care time)  Medications Ordered in UC Medications  acetaminophen (TYLENOL) tablet 650 mg (650 mg Oral Given 04/27/20 2023)    Initial Impression / Assessment and Plan / UC Course  I have reviewed the triage vital signs and the nursing notes.  Pertinent labs & imaging results  that were available during my care of the patient were reviewed by me and considered in my medical decision making (see chart for details).     #  Pyelonephritis #Tachycardia Patient is a 39 year old recent history of anemia presenting with pyelonephritis.  Fever 103.1 and tachycardic to 130s on initial examination.  Urine with sign of infection and CVA, high likelihood of pyelonephritis.  Given high fever and tachycardia concern for severe infection.  Though she is tolerating orals at this time do feel she needs higher evaluation tonight in the emergency department then can be supplied in the urgent care this evening given high fever, tachycardia, recent uterine instrumentation as well as anemia..  Given some colicky nature and pain with movement could represent renal stone with infection as well.  Patient was given Tylenol in clinic and I discussed that my recommendation and instructions are for her to report to the emergency department this evening for further evaluation and stressed the importance of staying for evaluation as I am concerned she has a worsening infection.  Patient is agreeable to this plan of care and reports she will report to [facility] emergency department this evening.  Patient does have fever and tachycardia however feel she is stable for self transport as she is a normal blood pressure and normal respiratory count.  Patient will self transport to [facility] emergency department Final Clinical Impressions(s) / UC Diagnoses   Final diagnoses:  Pyelonephritis  Tachycardia     Discharge Instructions     Given your symptoms, high fever and heart rate you need to be further evaluated at a higher level of care tonight in the Emergency Department. It is very important you get this full work up [employer].    ED Prescriptions    None     PDMP not reviewed this encounter.   Purnell Shoemaker, PA-C 04/27/20 [phone]

## 2020-04-27 NOTE — ED Triage Notes (Signed)
Left flank pain, slight right flank pain that started last week.  Patient had chills today.  Urine has been cloudy.    Patient had a biopsy for endometrium on July 2.

## 2020-04-28 ENCOUNTER — Emergency Department (HOSPITAL_COMMUNITY): Payer: 59

## 2020-04-28 LAB — PREGNANCY, URINE: Preg Test, Ur: NEGATIVE

## 2020-04-28 MED ORDER — IOHEXOL 300 MG/ML  SOLN
100.0000 mL | Freq: Once | INTRAMUSCULAR | Status: AC | PRN
  Administered 2020-04-28: 100 mL via INTRAVENOUS

## 2020-04-28 MED ORDER — CEPHALEXIN 500 MG PO CAPS
500.0000 mg | ORAL_CAPSULE | Freq: Three times a day (TID) | ORAL | 0 refills | Status: AC
Start: 2020-04-29 — End: 2020-05-09

## 2020-04-28 MED ORDER — SODIUM CHLORIDE 0.9 % IV BOLUS
1000.0000 mL | Freq: Once | INTRAVENOUS | Status: AC
  Administered 2020-04-28: 1000 mL via INTRAVENOUS

## 2020-04-28 MED ORDER — SODIUM CHLORIDE 0.9 % IV SOLN
1.0000 g | Freq: Once | INTRAVENOUS | Status: AC
  Administered 2020-04-28: 1 g via INTRAVENOUS
  Filled 2020-04-28: qty 10

## 2020-04-28 NOTE — ED Provider Notes (Signed)
[facility] EMERGENCY DEPARTMENT Provider Note   CSN: [id number] Arrival date & time: 04/27/20  2039     History Chief Complaint  Patient presents with   Flank Pain   [name] is a 39 y.o. female history of fibroids, anemia on iron transfusions, obesity, presented to emergency department with dysuria and flank pain.  She reports onset of symptoms about 3 days ago.  She noticed the pain in her right flank, as well as urinary frequency for the past 3 days.  She said the frequency is similar to her prior UTIs.  She notes that her urine appeared cloudier than normal.  She does report she had an endometrial biopsy done about a week ago by her OB/GYN doctor.  She denies any persistent lower abdominal pain.    She reports rigors yesterday, no fevers at home. 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   Migraines    "last one was years ago" (05/20/2014)   Thyroid disease     Patient Active Problem List   Diagnosis Date Noted   Acute on chronic cholecystitis 05/20/2014   Dysuria 05/20/2014   Hypothyroidism (acquired) 05/20/2014   Bilateral low back pain without sciatica 05/14/2014    Past Surgical History:  Procedure Laterality Date   CHOLECYSTECTOMY N/A 05/20/2014   Procedure: LAPAROSCOPIC CHOLECYSTECTOMY WITH INTRAOPERATIVE CHOLANGIOGRAM;  Surgeon: Gayland Curry, MD;  Location: [facility];  Service: General;  Laterality: N/A;   LAPAROSCOPIC CHOLECYSTECTOMY  05/20/2014   MYOMECTOMY  04/2013     OB History   No obstetric history on file.     Family History  Problem Relation Age of Onset   Lupus Mother    Diabetes Father     Social History   Tobacco Use   Smoking status: Never Smoker    Smokeless tobacco: Never Used  Vaping Use   Vaping Use: Never used  Substance Use Topics   Alcohol use: Yes    Comment: social   Drug use: No    Home Medications Prior to Admission medications   Medication Sig Start Date End Date Taking? Authorizing Provider  Fe Fum-FePoly-Vit C-Vit B3 (INTEGRA) 62.5-62.5-40-3 MG CAPS Take 1 capsule by mouth every evening.  03/29/20  Yes [provider]  levothyroxine (SYNTHROID) 175 MCG tablet Take 175 mcg by mouth daily. 03/26/20  Yes [provider]  naproxen sodium (ALEVE) 220 MG tablet Take 220 mg by mouth.   Yes [provider]  YASMIN 28 3-0.03 MG tablet Take 1 tablet by mouth daily. 03/26/20  Yes [provider]  cephALEXin (KEFLEX) 500 MG capsule Take 1 capsule (500 mg total) by mouth 3 (three) times daily for 10 days. 04/29/20 05/09/20  Wyvonnia Dusky, MD  ferrous sulfate 325 (65 FE) MG tablet Take 1 tablet (325 mg total) by mouth daily. Patient not taking: Reported on 04/28/2020 03/27/20   Petrucelli, Aldona Bar R, PA-C  fluticasone (FLONASE) 50 MCG/ACT nasal spray Place 2 sprays into both nostrils daily. Patient not taking: Reported on 03/27/2020 10/06/18 03/27/20  Ok Edwards, PA-C  ipratropium (ATROVENT) 0.06 % nasal spray Place 2 sprays into both nostrils 4 (four) times daily. Patient not taking:  Reported on 03/27/2020 10/06/18 03/27/20  Ok Edwards, PA-C    Allergies    Patient has no known allergies.  Review of Systems   Review of Systems  Constitutional: Negative for chills and fever.  HENT: Negative for ear pain and sore throat.   Eyes: Negative for pain and visual disturbance.  Respiratory: Negative for cough and shortness of breath.   Cardiovascular: Negative for chest pain and palpitations.  Gastrointestinal: Positive for abdominal pain. Negative for nausea and vomiting.  Genitourinary: Positive for flank pain and frequency. Negative for pelvic pain.  Musculoskeletal: Negative for arthralgias and  back pain.  Skin: Negative for color change and rash.  Neurological: Negative for syncope and light-headedness.  Psychiatric/Behavioral: Negative for agitation and confusion.  All other systems reviewed and are negative.   Physical Exam Updated Vital Signs BP 126/69 (BP Location: Left Arm)    Pulse (!) 120    Temp (!) 101.1 F (38.4 C) (Oral)    Resp 18    Ht 5\' 7"  (1.702 m)    Wt 117.9 kg    LMP 04/19/2020    SpO2 100%    BMI 40.71 kg/m   Physical Exam Vitals and nursing note reviewed.  Constitutional:      General: She is not in acute distress.    Appearance: She is well-developed. She is obese.  HENT:     Head: Normocephalic and atraumatic.  Eyes:     Conjunctiva/sclera: Conjunctivae normal.  Cardiovascular:     Rate and Rhythm: Regular rhythm. Tachycardia present.     Pulses: Normal pulses.  Pulmonary:     Effort: Pulmonary effort is normal. No respiratory distress.     Breath sounds: Normal breath sounds.  Abdominal:     Palpations: Abdomen is soft.     Tenderness: There is abdominal tenderness in the epigastric area. There is right CVA tenderness and left CVA tenderness. There is no guarding or rebound.  Musculoskeletal:     Cervical back: Neck supple.  Skin:    General: Skin is warm and dry.  Neurological:     General: No focal deficit present.     Mental Status: She is alert and oriented to person, place, and time.  Psychiatric:        Mood and Affect: Mood normal.        Behavior: Behavior normal.     ED Results / Procedures / Treatments   Labs (all labs ordered are listed, but only abnormal results are displayed) Labs Reviewed  CBC WITH DIFFERENTIAL/PLATELET - Abnormal; Notable for the following components:      Result Value   WBC 12.0 (*)    Hemoglobin 8.7 (*)    HCT 29.0 (*)    MCV 69.9 (*)    MCH 21.0 (*)    RDW 24.5 (*)    Neutro Abs 10.5 (*)    All other components within normal limits  COMPREHENSIVE METABOLIC PANEL - Abnormal; Notable for the  following components:   Glucose, Bld 103 (*)    Total Protein 8.2 (*)    Albumin 3.4 (*)    AST 73 (*)    ALT 77 (*)    All other components within normal limits  URINALYSIS, ROUTINE W REFLEX MICROSCOPIC - Abnormal; Notable for the following components:   Hgb urine dipstick LARGE (*)    Leukocytes,Ua LARGE (*)    Bacteria, UA RARE (*)    All other components within normal limits  URINE CULTURE  PREGNANCY, URINE  LIPASE, BLOOD    EKG None  Radiology CT ABDOMEN PELVIS W CONTRAST  Result Date: 04/28/2020 CLINICAL DATA:  Right flank pain, fever, UTI EXAM: CT ABDOMEN AND PELVIS WITH CONTRAST TECHNIQUE: Multidetector CT imaging of the abdomen and pelvis was performed using the standard protocol following bolus administration of intravenous contrast. CONTRAST:  162mL OMNIPAQUE IOHEXOL 300 MG/ML  SOLN COMPARISON:  06/18/2009 FINDINGS: Lower chest: 3 mm subpleural nodule at the posterior left lower lobe (series 5, image 3). Visualized lung bases are otherwise clear. No acute findings. Hepatobiliary: Subcentimeter focus of enhancement at the lateral right hepatic dome, which may reflect a small vascular shunt or hemangioma (series 3, image 11), and is likely incidental. Otherwise, no focal liver abnormality is seen. Status post cholecystectomy. No biliary dilatation. Pancreas: Unremarkable. No pancreatic ductal dilatation or surrounding inflammatory changes. Spleen: Normal in size without focal abnormality. Adrenals/Urinary Tract: Unremarkable adrenal glands. Delayed left renal nephrogram with wedge-shaped areas of hypoattenuation (for example series 6, image 96). Subtle left perinephric stranding. Right kidney demonstrates normal enhancement. No shadowing stone or hydronephrosis. Urinary bladder appears unremarkable. Stomach/Bowel: Stomach is within normal limits. Appendix appears normal. No evidence of bowel wall thickening, distention, or inflammatory changes. Vascular/Lymphatic: No significant  vascular findings are present. No enlarged abdominal or pelvic lymph nodes. Reproductive: Enlarged uterus containing numerous masses, the largest measuring approximately 8.0 cm in diameter which is predominantly low attenuation. There are additional rounded masses of varying attenuation, some of which are coarsely calcified. No adnexal masses. Other: No free fluid or intra-abdominal fluid collection. No pneumoperitoneum. Musculoskeletal: No acute or significant osseous findings. IMPRESSION: 1. Delayed LEFT renal nephrogram with wedge-shaped areas of hypoattenuation and subtle perinephric stranding. Findings are concerning for pyelonephritis. 2. Enlarged uterus containing numerous masses, the largest measuring approximately 8.0 cm in diameter which is predominantly low attenuation. There are additional rounded masses of varying attenuation, some of which are coarsely calcified. Findings likely represent multiple uterine fibroids. 3. 3 mm subpleural nodule at the posterior left lower lobe. No follow-up needed if patient is low-risk. Non-contrast chest CT can be considered in 12 months if patient is high-risk. This recommendation follows the consensus statement: Guidelines for Management of Incidental Pulmonary Nodules Detected on CT Images: From the Fleischner Society 2017; Radiology 2017; 284:228-243. Electronically Signed   By: Davina Poke D.O.   On: 04/28/2020 13:50    Procedures Procedures (including critical care time)  Medications Ordered in ED Medications  cefTRIAXone (ROCEPHIN) 1 g in sodium chloride 0.9 % 100 mL IVPB (0 g Intravenous Stopped 04/28/20 1430)  sodium chloride 0.9 % bolus 1,000 mL (0 mLs Intravenous Stopped 04/28/20 1508)  iohexol (OMNIPAQUE) 300 MG/ML solution 100 mL (100 mLs Intravenous Contrast Given 04/28/20 1149)    ED Course  I have reviewed the triage vital signs and the nursing notes.  Pertinent labs & imaging results that were available during my care of the patient  were reviewed by me and considered in my medical decision making (see chart for details).  39 yo female presenting with flank pain, dysuria, rigors for the past 3 days.  Sent in from [facility] yesterday due to concern for possible pyelonephritis.  She has spent about 12-14 hours in our waiting room overnight unfortunately.  I agree that her history and exam are suggestive of UTI.  Labs reviewed with UA suggestive of UTI WBC 12.0 Cr 0.8  Plan on CT scan with IV contrast, IV rocephin, 1L IVF Doubtful of sepsis at this time - suspect we  have a localized source of infection   Gallbladder removed in the past, unlikely source of fever and pain    Clinical Course as of Apr 28 1702  Wed Apr 28, 2020  1320 There are technical issues with PACS, awaiting radiology report.     [MT]    Clinical Course User Index [MT] Wyvonnia Dusky, MD   Update - treated for mild pyelonephritis, left sided on CT scan, no kidney stones visible.  Patient stable on reassessment.  Plan for 10 more days of keflex TID after IV ceftriaxone given here.  Urine culture to be sent off.  Final Clinical Impression(s) / ED Diagnoses Final diagnoses:  Pyelonephritis    Rx / DC Orders ED Discharge Orders         Ordered    cephALEXin (KEFLEX) 500 MG capsule  3 times daily     Discontinue  Reprint     04/28/20 1407           Wyvonnia Dusky, MD 04/28/20 1704

## 2020-04-28 NOTE — Discharge Instructions (Signed)
You can take tylenol and motrin at home for aches and pains.    I expect your fever and chills to improve in next 24 hours.  Please follow up with your primary care clinic in 2-3 days for a reassessment.

## 2020-04-29 LAB — URINE CULTURE

## 2020-05-01 LAB — URINE CULTURE: Culture: >=100000 — AB

## 2020-05-02 ENCOUNTER — Telehealth: Payer: Self-pay | Admitting: Emergency Medicine

## 2020-05-02 NOTE — Telephone Encounter (Signed)
Post ED Visit - Positive Culture Follow-up  Culture report reviewed by antimicrobial stewardship pharmacist: [facility] Team []  Elenor Quinones, Pharm.D. []  Heide Guile, Pharm.D., BCPS AQ-ID []  Parks Neptune, Pharm.D., BCPS []  Alycia Rossetti, Pharm.D., BCPS []  [place], Pharm.D., BCPS, AAHIVP []  Legrand Como, Pharm.D., BCPS, AAHIVP []  Salome Arnt, PharmD, BCPS []  Johnnette Gourd, PharmD, BCPS []  Hughes Better, PharmD, BCPS [x]  Duanne Limerick, PharmD []  Laqueta Linden, PharmD, BCPS []  Albertina Parr, PharmD  [facility] Team []  Leodis Sias, PharmD []  Lindell Spar, PharmD []  Royetta Asal, PharmD []  Graylin Shiver, Rph []  Rema Fendt) Glennon Mac, PharmD []  Arlyn Dunning, PharmD []  Netta Cedars, PharmD []  Dia Sitter, PharmD []  Leone Haven, PharmD []  Gretta Arab, PharmD []  Theodis Shove, PharmD []  Peggyann Juba, PharmD []  Reuel Boom, PharmD   Positive urine culture Treated with Cephalexin, organism sensitive to the same and no further patient follow-up is required at this time.  Sandi Raveling Dayzha Pogosyan 05/02/2020, 5:29 PM

## 2020-05-24 ENCOUNTER — Encounter (HOSPITAL_COMMUNITY): Payer: Self-pay

## 2020-05-24 ENCOUNTER — Ambulatory Visit (HOSPITAL_COMMUNITY)
Admission: EM | Admit: 2020-05-24 | Discharge: 2020-05-24 | Disposition: A | Discharge status: Home / Self Care | Payer: 59 | Attending: Family Medicine | Admitting: Family Medicine

## 2020-05-24 ENCOUNTER — Other Ambulatory Visit: Payer: Self-pay

## 2020-05-24 DIAGNOSIS — N390 Urinary tract infection, site not specified: Secondary | ICD-10-CM

## 2020-05-24 DIAGNOSIS — Z3202 Encounter for pregnancy test, result negative: Secondary | ICD-10-CM | POA: Diagnosis not present

## 2020-05-24 DIAGNOSIS — D649 Anemia, unspecified: Secondary | ICD-10-CM | POA: Diagnosis present

## 2020-05-24 DIAGNOSIS — N12 Tubulo-interstitial nephritis, not specified as acute or chronic: Secondary | ICD-10-CM | POA: Insufficient documentation

## 2020-05-24 LAB — COMPREHENSIVE METABOLIC PANEL
ALT: 56 U/L — ABNORMAL HIGH (ref 0–44)
AST: 42 U/L — ABNORMAL HIGH (ref 15–41)
Albumin: 3.3 g/dL — ABNORMAL LOW (ref 3.5–5.0)
Alkaline Phosphatase: 60 U/L (ref 38–126)
Anion gap: 10 (ref 5–15)
BUN: 5 mg/dL — ABNORMAL LOW (ref 6–20)
CO2: 23 mmol/L (ref 22–32)
Calcium: 9.2 mg/dL (ref 8.9–10.3)
Chloride: 101 mmol/L (ref 98–111)
Creatinine, Ser: 1 mg/dL (ref 0.44–1.00)
GFR calc Af Amer: >60 mL/min (ref >60)
GFR calc non Af Amer: >60 mL/min (ref >60)
Glucose, Bld: 129 mg/dL — ABNORMAL HIGH (ref 70–99)
Potassium: 3.4 mmol/L — ABNORMAL LOW (ref 3.5–5.1)
Sodium: 134 mmol/L — ABNORMAL LOW (ref 135–145)
Total Bilirubin: 0.3 mg/dL (ref 0.3–1.2)
Total Protein: 8.6 g/dL — ABNORMAL HIGH (ref 6.5–8.1)

## 2020-05-24 LAB — POCT URINALYSIS DIPSTICK, ED / UC
Bilirubin Urine: NEGATIVE
Glucose, UA: NEGATIVE mg/dL
Ketones, ur: 15 mg/dL — AB
Nitrite: POSITIVE — AB
Protein, ur: 30 mg/dL — AB
Specific Gravity, Urine: 1.015 (ref 1.005–1.030)
Urobilinogen, UA: 0.2 mg/dL (ref 0.0–1.0)
pH: 6 (ref 5.0–8.0)

## 2020-05-24 LAB — CBC WITH DIFFERENTIAL/PLATELET
Abs Immature Granulocytes: 0.11 10*3/uL — ABNORMAL HIGH (ref 0.00–0.07)
Basophils Absolute: 0 10*3/uL (ref 0.0–0.1)
Basophils Relative: 0 %
Eosinophils Absolute: 0 10*3/uL (ref 0.0–0.5)
Eosinophils Relative: 0 %
HCT: 26.9 % — ABNORMAL LOW (ref 36.0–46.0)
Hemoglobin: 8.1 g/dL — ABNORMAL LOW (ref 12.0–15.0)
Immature Granulocytes: 1 %
Lymphocytes Relative: 9 %
Lymphs Abs: 1.3 10*3/uL (ref 0.7–4.0)
MCH: 21.8 pg — ABNORMAL LOW (ref 26.0–34.0)
MCHC: 30.1 g/dL (ref 30.0–36.0)
MCV: 72.3 fL — ABNORMAL LOW (ref 80.0–100.0)
Monocytes Absolute: 0.6 10*3/uL (ref 0.1–1.0)
Monocytes Relative: 4 %
Neutro Abs: 12 10*3/uL — ABNORMAL HIGH (ref 1.7–7.7)
Neutrophils Relative %: 86 %
Platelets: 284 10*3/uL (ref 150–400)
RBC: 3.72 MIL/uL — ABNORMAL LOW (ref 3.87–5.11)
RDW: 26.5 % — ABNORMAL HIGH (ref 11.5–15.5)
WBC: 14 10*3/uL — ABNORMAL HIGH (ref 4.0–10.5)
nRBC: 0 % (ref 0.0–0.2)

## 2020-05-24 LAB — POC URINE PREG, ED: Preg Test, Ur: NEGATIVE

## 2020-05-24 MED ORDER — ONDANSETRON 4 MG PO TBDP
4.0000 mg | ORAL_TABLET | Freq: Three times a day (TID) | ORAL | 0 refills | Status: DC | PRN
Start: 2020-05-24 — End: 2023-12-11

## 2020-05-24 MED ORDER — LIDOCAINE HCL (PF) 1 % IJ SOLN
INTRAMUSCULAR | Status: AC
  Filled 2020-05-24: qty 2

## 2020-05-24 MED ORDER — ACETAMINOPHEN 325 MG PO TABS
650.0000 mg | ORAL_TABLET | Freq: Once | ORAL | Status: AC
  Administered 2020-05-24: 650 mg via ORAL

## 2020-05-24 MED ORDER — FERROUS SULFATE 325 (65 FE) MG PO TABS
325.0000 mg | ORAL_TABLET | Freq: Every day | ORAL | 0 refills | Status: DC
Start: 2020-05-24 — End: 2023-12-11

## 2020-05-24 MED ORDER — FERROUS SULFATE 325 (65 FE) MG PO TABS
325.0000 mg | ORAL_TABLET | Freq: Every day | ORAL | 0 refills | Status: DC
Start: 2020-05-24 — End: 2020-05-24

## 2020-05-24 MED ORDER — ONDANSETRON HCL 4 MG/2ML IJ SOLN
4.0000 mg | Freq: Once | INTRAMUSCULAR | Status: AC
  Administered 2020-05-24: 4 mg via INTRAVENOUS

## 2020-05-24 MED ORDER — ONDANSETRON HCL 4 MG/2ML IJ SOLN
INTRAMUSCULAR | Status: AC
  Filled 2020-05-24: qty 2

## 2020-05-24 MED ORDER — SODIUM CHLORIDE 0.9 % IV BOLUS
1000.0000 mL | Freq: Once | INTRAVENOUS | Status: AC
  Administered 2020-05-24: 1000 mL via INTRAVENOUS

## 2020-05-24 MED ORDER — CEFTRIAXONE SODIUM 1 G IJ SOLR
1.0000 g | Freq: Once | INTRAMUSCULAR | Status: AC
  Administered 2020-05-24: 1 g via INTRAMUSCULAR

## 2020-05-24 MED ORDER — SULFAMETHOXAZOLE-TRIMETHOPRIM 800-160 MG PO TABS
1.0000 | ORAL_TABLET | Freq: Two times a day (BID) | ORAL | 0 refills | Status: AC
Start: 2020-05-24 — End: 2020-06-03

## 2020-05-24 MED ORDER — ACETAMINOPHEN 325 MG PO TABS
ORAL_TABLET | ORAL | Status: AC
  Filled 2020-05-24: qty 2

## 2020-05-24 MED ORDER — CEFTRIAXONE SODIUM 1 G IJ SOLR
INTRAMUSCULAR | Status: AC
  Filled 2020-05-24: qty 10

## 2020-05-24 MED ORDER — ONDANSETRON HCL 4 MG/2ML IJ SOLN: 4.0000 mg | Freq: Once | INTRAMUSCULAR | Status: DC

## 2020-05-24 NOTE — ED Provider Notes (Signed)
[facility]    CSN: [id number] Arrival date & time: 05/24/20  0809      History   Chief Complaint Chief Complaint  Patient presents with  . Urinary Tract Infection    HPI [name] is a 39 y.o. female.   Patient is a 39 year old female past medical history of anemia, fibroids, thyroid disease, migraines, pyelonephritis.  She presents today for fever, right and left flank pain worse on the left.  This started yesterday.  Patient was treated for pyelonephritis approximate 3 weeks ago in the ER.  Had a repeat urinalysis after this with negative results.  Reporting symptoms have resolved but have returned.  She has had nausea and vomiting associated.  She also just finished a pretty heavy menstrual cycle with passing blood clots.  Known uterine fibroids and has been following up with OB/GYN for this.  ROS per HPI      Past Medical History:  Diagnosis Date  . Anemia   . Fibroids   . History of blood transfusion    "related to fibroids"  . 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 Migraines    "last one was years ago" (05/20/2014)  . Thyroid disease     Patient Active Problem List   Diagnosis Date Noted  . Acute on chronic cholecystitis 05/20/2014  . Dysuria 05/20/2014  . Hypothyroidism (acquired) 05/20/2014  . Bilateral low back pain without sciatica 05/14/2014    Past Surgical History:  Procedure Laterality Date  . CHOLECYSTECTOMY N/A 05/20/2014   Procedure: LAPAROSCOPIC CHOLECYSTECTOMY WITH INTRAOPERATIVE CHOLANGIOGRAM;  Surgeon: Gayland Curry, MD;  Location: [facility];  Service: General;  Laterality: N/A;  . LAPAROSCOPIC CHOLECYSTECTOMY  05/20/2014  . MYOMECTOMY  04/2013    OB History   No obstetric history on file.      Home Medications    Prior to Admission medications   Medication Sig Start Date End Date Taking? Authorizing Provider  Fe Fum-FePoly-Vit C-Vit B3 (INTEGRA) 62.5-62.5-40-3 MG CAPS Take 1 capsule by mouth every  evening.  03/29/20   [provider]  ferrous sulfate 325 (65 FE) MG tablet Take 1 tablet (325 mg total) by mouth daily. 05/24/20   Loura Halt A, NP  levothyroxine (SYNTHROID) 175 MCG tablet Take 175 mcg by mouth daily. 03/26/20   [provider]  naproxen sodium (ALEVE) 220 MG tablet Take 220 mg by mouth.    [provider]  ondansetron (ZOFRAN ODT) 4 MG disintegrating tablet Take 1 tablet (4 mg total) by mouth every 8 (eight) hours as needed for nausea or vomiting. 05/24/20   Loura Halt A, NP  sulfamethoxazole-trimethoprim (BACTRIM DS) 800-160 MG tablet Take 1 tablet by mouth 2 (two) times daily for 10 days. 05/24/20 06/03/20  Shamel Galyean, Tressia Miners A, NP  YASMIN 28 3-0.03 MG tablet Take 1 tablet by mouth daily. 03/26/20   [provider]  fluticasone (FLONASE) 50 MCG/ACT nasal spray Place 2 sprays into both nostrils daily. Patient not taking: Reported on 03/27/2020 10/06/18 03/27/20  Ok Edwards, PA-C  ipratropium (ATROVENT) 0.06 % nasal spray Place 2 sprays into both nostrils 4 (four) times daily. Patient not taking: Reported on 03/27/2020 10/06/18 03/27/20  Arturo Morton    Family History Family History  Problem Relation Age of Onset  . Lupus Mother   . Diabetes Father     Social History Social History   Tobacco Use  . Smoking status: Never Smoker  . Smokeless tobacco:  Never Used  Vaping Use  . Vaping Use: Never used  Substance Use Topics  . Alcohol use: Yes    Comment: social  . Drug use: No     Allergies   Patient has no known allergies.   Review of Systems Review of Systems   Physical Exam Triage Vital Signs ED Triage Vitals  Enc Vitals Group     BP 05/24/20 0842 136/65     Pulse Rate 05/24/20 0842 (!) 133     Resp 05/24/20 0842 16     Temp 05/24/20 0842 (!) 102.8 F (39.3 C)     Temp Source 05/24/20 0842 Oral     SpO2 05/24/20 0842 100 %     Weight 05/24/20 0843 285 lb (129.3 kg)     Height 05/24/20 0843 5\' 7"  (1.702 m)     Head  Circumference --      Peak Flow --      Pain Score 05/24/20 0843 7     Pain Loc --      Pain Edu? --      Excl. in [place]? --    No data found.  Updated Vital Signs BP 117/65   Pulse (!) 120   Temp (!) 101.2 F (38.4 C)   Resp 16   Ht 5\' 7"  (1.702 m)   Wt 285 lb (129.3 kg)   SpO2 97%   BMI 44.64 kg/m   Visual Acuity Right Eye Distance:   Left Eye Distance:   Bilateral Distance:    Right Eye Near:   Left Eye Near:    Bilateral Near:     Physical Exam Vitals and nursing note reviewed.  Constitutional:      Appearance: Normal appearance. She is ill-appearing. She is not toxic-appearing.  HENT:     Head: Normocephalic and atraumatic.     Nose: Nose normal.  Cardiovascular:     Rate and Rhythm: Tachycardia present.  Pulmonary:     Effort: Pulmonary effort is normal.  Abdominal:     Tenderness: There is right CVA tenderness and left CVA tenderness.  Skin:    General: Skin is warm and dry.  Neurological:     Mental Status: She is alert.  Psychiatric:        Mood and Affect: Mood normal.      UC Treatments / Results  Labs (all labs ordered are listed, but only abnormal results are displayed) Labs Reviewed  CBC WITH DIFFERENTIAL/PLATELET - Abnormal; Notable for the following components:      Result Value   WBC 14.0 (*)    RBC 3.72 (*)    Hemoglobin 8.1 (*)    HCT 26.9 (*)    MCV 72.3 (*)    MCH 21.8 (*)    RDW 26.5 (*)    Neutro Abs 12.0 (*)    Abs Immature Granulocytes 0.11 (*)    All other components within normal limits  COMPREHENSIVE METABOLIC PANEL - Abnormal; Notable for the following components:   Sodium 134 (*)    Potassium 3.4 (*)    Glucose, Bld 129 (*)    BUN 5 (*)    Total Protein 8.6 (*)    Albumin 3.3 (*)    AST 42 (*)    ALT 56 (*)    All other components within normal limits  POCT URINALYSIS DIPSTICK, ED / UC - Abnormal; Notable for the following components:   Ketones, ur 15 (*)    Hgb urine dipstick LARGE (*)  Protein, ur 30 (*)     Nitrite POSITIVE (*)    Leukocytes,Ua SMALL (*)    All other components within normal limits  URINE CULTURE  POC URINE PREG, ED    EKG   Radiology No results found.  Procedures Procedures (including critical care time)  Medications Ordered in UC Medications  acetaminophen (TYLENOL) tablet 650 mg (650 mg Oral Given 05/24/20 0911)  ondansetron (ZOFRAN) injection 4 mg (4 mg Intravenous Given 05/24/20 0901)  sodium chloride 0.9 % bolus 1,000 mL (1,000 mLs Intravenous New Bag/Given 05/24/20 0901)  cefTRIAXone (ROCEPHIN) injection 1 g (1 g Intramuscular Given 05/24/20 0909)    Initial Impression / Assessment and Plan / UC Course  I have reviewed the triage vital signs and the nursing notes.  Pertinent labs & imaging results that were available during my care of the patient were reviewed by me and considered in my medical decision making (see chart for details).     Pyelonephritis Patient with small leuks, positive nitrites and large hemoglobin.  Sending for culture.  Patient with previous pyonephritis approximately 3 weeks ago and treated.  Patient treated today with normal saline bolus to rehydrate, Zofran, Tylenol and Rocephin in clinic. Elevated white blood cell count of 14.  Mildly elevated AST and ALT and sodium potassium slightly low. Vital signs slightly improved with recheck. Treating the infection with Bactrim outpatient.  Recommended push fluids to include water and Gatorade with electrolytes. If her symptoms worsen over the next 24 to 48 hours she will need to go to the ER  Low hemoglobin Patient's hemoglobin went from 8.7-8.1 over the past few weeks.  She just had heavy menstrual cycle with blood clots.  Reporting the bleeding has currently stopped.  She was told to restart iron next week but we will go ahead and have her restart iron now Plans to follow-up with OB/GYN this week.    Final Clinical Impressions(s) / UC Diagnoses   Final diagnoses:  Pyelonephritis  Low  hemoglobin     Discharge Instructions     Treating you for kidney infection Take the antibiotics as prescribed.  We rehydrated you here today.  And gave you antibiotic injection and Zofran.  Make sure you are drinking plenty of water, push fluids Follow-up with your OB/GYN this week.  If your symptoms worsen please go to the ER.    ED Prescriptions    Medication Sig Dispense Auth. Provider   sulfamethoxazole-trimethoprim (BACTRIM DS) 800-160 MG tablet Take 1 tablet by mouth 2 (two) times daily for 10 days. 20 tablet Jaliah Foody A, NP   ondansetron (ZOFRAN ODT) 4 MG disintegrating tablet Take 1 tablet (4 mg total) by mouth every 8 (eight) hours as needed for nausea or vomiting. 20 tablet Romain Erion A, NP   ferrous sulfate 325 (65 FE) MG tablet  (Status: Discontinued) Take 1 tablet (325 mg total) by mouth daily. 30 tablet Penn Grissett A, NP   ferrous sulfate 325 (65 FE) MG tablet Take 1 tablet (325 mg total) by mouth daily. 30 tablet Loura Halt A, NP     PDMP not reviewed this encounter.   Loura Halt A, NP 05/24/20 1055

## 2020-05-24 NOTE — ED Triage Notes (Addendum)
Pt c/o fever, right and left flank pain, but worse on left side and lower back pain since yesterday. Pt states didn't have an appetite yesterday. Pt actively vomiting.

## 2020-05-24 NOTE — Discharge Instructions (Signed)
Treating you for kidney infection Take the antibiotics as prescribed.  We rehydrated you here today.  And gave you antibiotic injection and Zofran.  Make sure you are drinking plenty of water, push fluids Follow-up with your OB/GYN this week.  If your symptoms worsen please go to the ER.

## 2020-05-26 LAB — URINE CULTURE: Culture: >=100000 — AB

## 2020-06-27 ENCOUNTER — Encounter (HOSPITAL_COMMUNITY): Payer: Self-pay

## 2020-06-27 ENCOUNTER — Ambulatory Visit (HOSPITAL_COMMUNITY)
Admission: EM | Admit: 2020-06-27 | Discharge: 2020-06-27 | Disposition: A | Discharge status: Home / Self Care | Payer: 59 | Attending: Urgent Care | Admitting: Urgent Care

## 2020-06-27 ENCOUNTER — Other Ambulatory Visit: Payer: Self-pay

## 2020-06-27 DIAGNOSIS — M79621 Pain in right upper arm: Secondary | ICD-10-CM | POA: Diagnosis not present

## 2020-06-27 DIAGNOSIS — L02411 Cutaneous abscess of right axilla: Secondary | ICD-10-CM

## 2020-06-27 MED ORDER — DOXYCYCLINE HYCLATE 100 MG PO CAPS
100.0000 mg | ORAL_CAPSULE | Freq: Two times a day (BID) | ORAL | 0 refills | Status: DC
Start: 2020-06-27 — End: 2021-12-22

## 2020-06-27 MED ORDER — NAPROXEN 500 MG PO TABS
500.0000 mg | ORAL_TABLET | Freq: Two times a day (BID) | ORAL | 0 refills | Status: DC
Start: 2020-06-27 — End: 2023-12-11

## 2020-06-27 NOTE — ED Provider Notes (Signed)
[place]   MRN: [id number] DOB: 1980/11/13  Subjective:   [name] is a 39 y.o. female presenting for 1 week history of persistent and worsening right underarm pain.  Patient has noticed a few spots that are swollen.  She states that they are very tender to touch but one of them has come to ahead.  There is no drainage.  Denies fever, nausea, vomiting, belly pain.  No current facility-administered medications for this encounter.  Current Outpatient Medications:  .  Fe Fum-FePoly-Vit C-Vit B3 (INTEGRA) 62.5-62.5-40-3 MG CAPS, Take 1 capsule by mouth every evening. , Disp: , Rfl:  .  ferrous sulfate 325 (65 FE) MG tablet, Take 1 tablet (325 mg total) by mouth daily., Disp: 30 tablet, Rfl: 0 .  levothyroxine (SYNTHROID) 175 MCG tablet, Take 175 mcg by mouth daily., Disp: , Rfl:  .  naproxen sodium (ALEVE) 220 MG tablet, Take 220 mg by mouth., Disp: , Rfl:  .  ondansetron (ZOFRAN ODT) 4 MG disintegrating tablet, Take 1 tablet (4 mg total) by mouth every 8 (eight) hours as needed for nausea or vomiting., Disp: 20 tablet, Rfl: 0 .  YASMIN 28 3-0.03 MG tablet, Take 1 tablet by mouth daily., Disp: , Rfl:    No Known Allergies  Past Medical History:  Diagnosis Date  . Anemia   . Fibroids   . History of blood transfusion    "related to fibroids"  . 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 Migraines    "last one was years ago" (05/20/2014)  . Thyroid disease      Past Surgical History:  Procedure Laterality Date  . CHOLECYSTECTOMY N/A 05/20/2014   Procedure: LAPAROSCOPIC CHOLECYSTECTOMY WITH INTRAOPERATIVE CHOLANGIOGRAM;  Surgeon: Gayland Curry, MD;  Location: [place];  Service: General;  Laterality: N/A;  . LAPAROSCOPIC CHOLECYSTECTOMY  05/20/2014  . MYOMECTOMY  04/2013    Family History  Problem Relation Age of Onset  . Lupus Mother   . Diabetes Father     Social History   Tobacco Use  . Smoking status: Never Smoker  . Smokeless  tobacco: Never Used  Vaping Use  . Vaping Use: Never used  Substance Use Topics  . Alcohol use: Yes    Comment: social  . Drug use: No    ROS   Objective:   Vitals: BP 115/79   Pulse 84   Temp 98.4 F (36.9 C)   Resp 19   LMP 06/27/2020   SpO2 100%   Physical Exam Constitutional:      General: She is not in acute distress.    Appearance: Normal appearance. She is well-developed. She is not ill-appearing, toxic-appearing or diaphoretic.  HENT:     Head: Normocephalic and atraumatic.     Nose: Nose normal.     Mouth/Throat:     Mouth: Mucous membranes are moist.     Pharynx: Oropharynx is clear.  Eyes:     General: No scleral icterus.    Extraocular Movements: Extraocular movements intact.     Pupils: Pupils are equal, round, and reactive to light.  Cardiovascular:     Rate and Rhythm: Normal rate.  Pulmonary:     Effort: Pulmonary effort is normal.  Skin:    General: Skin is warm and dry.       Neurological:     General: No focal deficit present.     Mental Status: She is alert and oriented to person,  place, and time.  Psychiatric:        Mood and Affect: Mood normal.        Behavior: Behavior normal.    PROCEDURE NOTE: I&D of Abscess Verbal consent obtained. Local anesthesia with 1cc of 1.5% lidocaine with epinpehrine. Site cleansed with Betadine. Incision of 1cm was made using a 11 blade, discharge of ~3cc pus and serosanguinous fluid. Wound cavity was explored with curved hemostats. Cleansed and dressed.  Assessment and Plan :   PDMP not reviewed this encounter.  1. Abscess of axilla, right   2. Pain in right axilla     I&D successful.  Start doxycycline, naproxen.  Counseled on wound care.  Follow-up with a dermatologist for possible hidradenitis. Counseled patient on potential for adverse effects with medications prescribed/recommended today, ER and return-to-clinic precautions discussed, patient verbalized understanding.    Jaynee Eagles,  PA-C 06/27/20 1358

## 2020-06-27 NOTE — ED Triage Notes (Signed)
Pt presents with abscess to right under arm x 1 week. Area is red, swollen, and tender to touch. States the area around it is tender as well. No drainage present.

## 2020-06-27 NOTE — Discharge Instructions (Signed)
Please change your dressing 3-5 times daily. Do not apply any ointments or creams. Each time you change your dressing, make sure that you are pressing on the wound to get pus to come out.  Try your best to have a family member help you clean gently around the perimeter of the wound with gentle soap and warm water. Pat your wound dry and let it air out if possible to make sure it is dry before reapplying another dressing.   Check in with a dermatologist for an evaluation for hidradenitis.

## 2021-05-02 ENCOUNTER — Other Ambulatory Visit: Payer: Self-pay

## 2021-05-02 ENCOUNTER — Emergency Department (HOSPITAL_BASED_OUTPATIENT_CLINIC_OR_DEPARTMENT_OTHER)
Admission: EM | Admit: 2021-05-02 | Discharge: 2021-05-02 | Disposition: A | Discharge status: Home / Self Care | Payer: 59 | Attending: Emergency Medicine | Admitting: Emergency Medicine

## 2021-05-02 ENCOUNTER — Encounter (HOSPITAL_BASED_OUTPATIENT_CLINIC_OR_DEPARTMENT_OTHER): Payer: Self-pay | Admitting: *Deleted

## 2021-05-02 DIAGNOSIS — N939 Abnormal uterine and vaginal bleeding, unspecified: Secondary | ICD-10-CM | POA: Diagnosis present

## 2021-05-02 DIAGNOSIS — N9489 Other specified conditions associated with female genital organs and menstrual cycle: Secondary | ICD-10-CM | POA: Diagnosis not present

## 2021-05-02 DIAGNOSIS — D219 Benign neoplasm of connective and other soft tissue, unspecified: Secondary | ICD-10-CM

## 2021-05-02 DIAGNOSIS — D259 Leiomyoma of uterus, unspecified: Secondary | ICD-10-CM | POA: Insufficient documentation

## 2021-05-02 DIAGNOSIS — D649 Anemia, unspecified: Secondary | ICD-10-CM | POA: Diagnosis not present

## 2021-05-02 DIAGNOSIS — E018 Other iodine-deficiency related thyroid disorders and allied conditions: Secondary | ICD-10-CM | POA: Diagnosis not present

## 2021-05-02 DIAGNOSIS — Z79899 Other long term (current) drug therapy: Secondary | ICD-10-CM | POA: Insufficient documentation

## 2021-05-02 DIAGNOSIS — N938 Other specified abnormal uterine and vaginal bleeding: Secondary | ICD-10-CM

## 2021-05-02 LAB — CBC WITH DIFFERENTIAL/PLATELET
Abs Immature Granulocytes: 0.05 10*3/uL (ref 0.00–0.07)
Basophils Absolute: 0 10*3/uL (ref 0.0–0.1)
Basophils Relative: 0 %
Eosinophils Absolute: 0 10*3/uL (ref 0.0–0.5)
Eosinophils Relative: 1 %
HCT: 19.2 % — ABNORMAL LOW (ref 36.0–46.0)
Hemoglobin: 5.2 g/dL — CL (ref 12.0–15.0)
Immature Granulocytes: 1 %
Lymphocytes Relative: 25 %
Lymphs Abs: 2.2 10*3/uL (ref 0.7–4.0)
MCH: 20.6 pg — ABNORMAL LOW (ref 26.0–34.0)
MCHC: 27.1 g/dL — ABNORMAL LOW (ref 30.0–36.0)
MCV: 76.2 fL — ABNORMAL LOW (ref 80.0–100.0)
Monocytes Absolute: 0.3 10*3/uL (ref 0.1–1.0)
Monocytes Relative: 4 %
Neutro Abs: 6.1 10*3/uL (ref 1.7–7.7)
Neutrophils Relative %: 69 %
Platelets: 480 10*3/uL — ABNORMAL HIGH (ref 150–400)
RBC: 2.52 MIL/uL — ABNORMAL LOW (ref 3.87–5.11)
RDW: 27.7 % — ABNORMAL HIGH (ref 11.5–15.5)
WBC: 8.7 10*3/uL (ref 4.0–10.5)
nRBC: 1 % — ABNORMAL HIGH (ref 0.0–0.2)

## 2021-05-02 LAB — PROTIME-INR
INR: 1.1 (ref 0.8–1.2)
Prothrombin Time: 13.9 seconds (ref 11.4–15.2)

## 2021-05-02 LAB — PREGNANCY, URINE: Preg Test, Ur: NEGATIVE

## 2021-05-02 LAB — BASIC METABOLIC PANEL
Anion gap: 9 (ref 5–15)
BUN: 9 mg/dL (ref 6–20)
CO2: 23 mmol/L (ref 22–32)
Calcium: 8.7 mg/dL — ABNORMAL LOW (ref 8.9–10.3)
Chloride: 105 mmol/L (ref 98–111)
Creatinine, Ser: 0.97 mg/dL (ref 0.44–1.00)
GFR, Estimated: >60 mL/min (ref >60)
Glucose, Bld: 133 mg/dL — ABNORMAL HIGH (ref 70–99)
Potassium: 3.4 mmol/L — ABNORMAL LOW (ref 3.5–5.1)
Sodium: 137 mmol/L (ref 135–145)

## 2021-05-02 LAB — I-STAT BETA HCG BLOOD, ED (MC, WL, AP ONLY): I-stat hCG, quantitative: <5 m[IU]/mL (ref <5)

## 2021-05-02 LAB — PREPARE RBC (CROSSMATCH)

## 2021-05-02 MED ORDER — SODIUM CHLORIDE 0.9 % IV SOLN
10.0000 mL/h | Freq: Once | INTRAVENOUS | Status: AC
  Administered 2021-05-02: 10 mL/h via INTRAVENOUS

## 2021-05-02 MED ORDER — NORETHINDRONE ACETATE 5 MG PO TABS: 10.0000 mg | ORAL_TABLET | Freq: Every day | ORAL | 0 refills | Status: DC

## 2021-05-02 MED ORDER — ACETAMINOPHEN 500 MG PO TABS
1000.0000 mg | ORAL_TABLET | Freq: Once | ORAL | Status: AC
Start: 2021-05-02 — End: 2021-05-02
  Administered 2021-05-02: 1000 mg via ORAL
  Filled 2021-05-02: qty 2

## 2021-05-02 MED ORDER — SODIUM CHLORIDE 0.9 % IV BOLUS
1000.0000 mL | Freq: Once | INTRAVENOUS | Status: AC
  Administered 2021-05-02: 1000 mL via INTRAVENOUS

## 2021-05-02 NOTE — Discharge Instructions (Addendum)
Please pick up prescription and take as prescribed until your myomectomy in 1 months time  Call your OBGYN tomorrow to let them know you were in the ED and received a blood transfusion due to heavy vaginal bleeding.  You should have your hemoglobin rechecked in 1 week by your PCP or OBGYN  Return to the ED for any new/worsening symptoms

## 2021-05-02 NOTE — ED Notes (Signed)
Pt placed on cardiac monitor 

## 2021-05-02 NOTE — ED Notes (Signed)
Pt ambulating to rest room.

## 2021-05-02 NOTE — ED Provider Notes (Addendum)
[place] EMERGENCY DEPT Provider Note   CSN: [id number] Arrival date & time: 05/02/21  0808     History Chief Complaint  Patient presents with   Vaginal Bleeding    [name] is a 40 y.o. female.  Pt presents to the ED today with vaginal bleeding.  Pt has a hx of fibroids and is scheduled for another myomectomy next month at [facility].  She has been spotting all year with intermittent heavy bleeding.  She has had heavy bleeding for the past week.  She feels sob like she did before needing a blood transfusion in the past.  She is currently taking Aygestin 5 mg once a day to help with the bleeding.      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   Migraines    "last one was years ago" (05/20/2014)   Thyroid disease     Patient Active Problem List   Diagnosis Date Noted   Acute on chronic cholecystitis 05/20/2014   Dysuria 05/20/2014   Hypothyroidism (acquired) 05/20/2014   Bilateral low back pain without sciatica 05/14/2014    Past Surgical History:  Procedure Laterality Date   CHOLECYSTECTOMY N/A 05/20/2014   Procedure: LAPAROSCOPIC CHOLECYSTECTOMY WITH INTRAOPERATIVE CHOLANGIOGRAM;  Surgeon: Gayland Curry, MD;  Location: [place];  Service: General;  Laterality: N/A;   KNEE SURGERY     LAPAROSCOPIC CHOLECYSTECTOMY  05/20/2014   MYOMECTOMY  04/15/2013     OB History   No obstetric history on file.     Family History  Problem Relation Age of Onset   Lupus Mother    Diabetes Father     Social History   Tobacco Use   Smoking status: Never   Smokeless tobacco: Never  Vaping Use   Vaping Use: Never used  Substance Use Topics   Alcohol use: Yes    Comment: social   Drug use: No    Home Medications Prior to Admission medications   Medication Sig Start Date End Date Taking? Authorizing Provider   ferrous sulfate 325 (65 FE) MG tablet Take 1 tablet (325 mg total) by mouth daily. 05/24/20  Yes Bast, Traci A, NP  levothyroxine (SYNTHROID) 175 MCG tablet Take 175 mcg by mouth daily. 03/26/20  Yes [provider]  doxycycline (VIBRAMYCIN) 100 MG capsule Take 1 capsule (100 mg total) by mouth 2 (two) times daily. 06/27/20   Jaynee Eagles, PA-C  Fe Fum-FePoly-Vit C-Vit B3 (INTEGRA) 62.5-62.5-40-3 MG CAPS Take 1 capsule by mouth every evening.  03/29/20   [provider]  naproxen (NAPROSYN) 500 MG tablet Take 1 tablet (500 mg total) by mouth 2 (two) times daily with a meal. 06/27/20   Jaynee Eagles, PA-C  naproxen sodium (ALEVE) 220 MG tablet Take 220 mg by mouth.    [provider]  ondansetron (ZOFRAN ODT) 4 MG disintegrating tablet Take 1 tablet (4 mg total) by mouth every 8 (eight) hours as needed for nausea or vomiting. 05/24/20   Bast, Traci A, NP  YASMIN 28 3-0.03 MG tablet Take 1 tablet by mouth daily. 03/26/20   [provider]  fluticasone (FLONASE) 50 MCG/ACT nasal spray Place 2 sprays into both nostrils daily. Patient not taking: Reported on 03/27/2020 10/06/18 03/27/20  Ok Edwards, PA-C  ipratropium (ATROVENT) 0.06 % nasal spray Place 2 sprays  into both nostrils 4 (four) times daily. Patient not taking: Reported on 03/27/2020 10/06/18 03/27/20  Ok Edwards, PA-C    Allergies    Patient has no known allergies.  Review of Systems   Review of Systems  Genitourinary:  Positive for vaginal bleeding.  All other systems reviewed and are negative.  Physical Exam Updated Vital Signs BP 125/81 (BP Location: Right Arm)   Pulse 95   Temp 98.3 F (36.8 C) (Oral)   Resp 16   Ht 5\' 7"  (1.702 m)   Wt 124.7 kg   SpO2 100%   BMI 43.07 kg/m   Physical Exam Vitals and nursing note reviewed. Exam conducted with a chaperone present.  Constitutional:      Appearance: Normal appearance.  HENT:     Head: Normocephalic and atraumatic.     Right Ear: External ear normal.      Left Ear: External ear normal.     Nose: Nose normal.     Mouth/Throat:     Mouth: Mucous membranes are moist.     Pharynx: Oropharynx is clear.  Eyes:     Extraocular Movements: Extraocular movements intact.     Conjunctiva/sclera: Conjunctivae normal.     Pupils: Pupils are equal, round, and reactive to light.  Cardiovascular:     Rate and Rhythm: Regular rhythm. Tachycardia present.     Pulses: Normal pulses.     Heart sounds: Normal heart sounds.  Pulmonary:     Effort: Pulmonary effort is normal.     Breath sounds: Normal breath sounds.  Abdominal:     General: Abdomen is flat. Bowel sounds are normal.     Palpations: Abdomen is soft.  Genitourinary:    Vagina: Bleeding present.     Cervix: Cervical bleeding present.     Uterus: Enlarged and tender.   Musculoskeletal:        General: Normal range of motion.     Cervical back: Normal range of motion and neck supple.  Skin:    General: Skin is warm.     Capillary Refill: Capillary refill takes less than 2 seconds.  Neurological:     General: No focal deficit present.     Mental Status: She is alert and oriented to person, place, and time.  Psychiatric:        Mood and Affect: Mood normal.        Behavior: Behavior normal.    ED Results / Procedures / Treatments   Labs (all labs ordered are listed, but only abnormal results are displayed) Labs Reviewed  BASIC METABOLIC PANEL - Abnormal; Notable for the following components:      Result Value   Potassium 3.4 (*)    Glucose, Bld 133 (*)    Calcium 8.7 (*)    All other components within normal limits  CBC WITH DIFFERENTIAL/PLATELET - Abnormal; Notable for the following components:   RBC 2.52 (*)    Hemoglobin 5.2 (*)    HCT 19.2 (*)    MCV 76.2 (*)    MCH 20.6 (*)    MCHC 27.1 (*)    RDW 27.7 (*)    Platelets 480 (*)    nRBC 1.0 (*)    All other components within normal limits  PROTIME-INR  PREGNANCY, URINE    EKG None  Radiology No results  found.  Procedures Procedures   Medications Ordered in ED Medications  sodium chloride 0.9 % bolus 1,000 mL (0 mLs Intravenous Stopped 05/02/21 1003)  ED Course  I have reviewed the triage vital signs and the nursing notes.  Pertinent labs & imaging results that were available during my care of the patient were reviewed by me and considered in my medical decision making (see chart for details).  Clinical Course as of 05/23/21 1627  Mon May 02, 2021  1831 10 mg aygestin po once daily  [MV]    Clinical Course User Index [MV] Eustaquio Maize, PA-C   MDM Rules/Calculators/A&P                          Pt is symptomatic from her anemia.  We do not have blood for transfusion here (only 2 units of emergency use blood).  She is d/w Obgyn on call who recommended transfer to [facility] ED for 2 units.  Call OB Gyn if pt is feeling better after transfusion to determine what meds to put her on at d/c.    CRITICAL CARE Performed by: Isla Pence   Total critical care time: 30 minutes  Critical care time was exclusive of separately billable procedures and treating other patients.  Critical care was necessary to treat or prevent imminent or life-threatening deterioration.  Critical care was time spent personally by me on the following activities: development of treatment plan with patient and/or surrogate as well as nursing, discussions with consultants, evaluation of patient's response to treatment, examination of patient, obtaining history from patient or surrogate, ordering and performing treatments and interventions, ordering and review of laboratory studies, ordering and review of radiographic studies, pulse oximetry and re-evaluation of patient's condition.   Pt d/w Dr. Raliegh Ip. Horton (ED) who accepted pt for transfer. Final Clinical Impression(s) / ED Diagnoses Final diagnoses:  Symptomatic anemia  DUB (dysfunctional uterine bleeding)  Fibroids    Rx / DC Orders ED Discharge Orders      None        Isla Pence, MD 05/02/21 [place], Draken Farrior, MD 05/23/21 1627

## 2021-05-02 NOTE — ED Notes (Signed)
Report called to Thurmond Butts RN at [facility] Report given to [facility] with [place].

## 2021-05-02 NOTE — ED Provider Notes (Signed)
Pt here from [place] requiring blood transfusion. Hx of fibroids and scheduled for additional myomectomy next month at [facility]. Began having heavy vaginal  bleeding about 1 week ago; going through about 8 pads per day ( has to double up every time ). Also having dyspnea on exertion and lightheadedness.  Plan to transfuse and touch base with OBGYN on call for additional recommendations.   Physical Exam  BP (!) 143/73   Pulse 92   Temp 99.1 F (37.3 C) (Oral)   Resp 15   Ht 5\' 7"  (1.702 m)   Wt 124.7 kg   SpO2 100%   BMI 43.07 kg/m   Physical Exam Vitals and nursing note reviewed.  Constitutional:      Appearance: She is not ill-appearing.  HENT:     Head: Normocephalic and atraumatic.  Eyes:     Conjunctiva/sclera: Conjunctivae normal.  Cardiovascular:     Rate and Rhythm: Normal rate and regular rhythm.  Pulmonary:     Effort: Pulmonary effort is normal.     Breath sounds: Normal breath sounds.  Skin:    General: Skin is warm and dry.     Coloration: Skin is not jaundiced.  Neurological:     Mental Status: She is alert.    ED Course/Procedures   Clinical Course as of 05/02/21 1841  Mon May 02, 2021  1831 10 mg aygestin po once daily  [MV]    Clinical Course User Index [MV] Eustaquio Maize, PA-C    Procedures  Results for orders placed or performed during the hospital encounter of 95/18/84  Basic metabolic panel  Result Value Ref Range   Sodium 137 135 - 145 mmol/L   Potassium 3.4 (L) 3.5 - 5.1 mmol/L   Chloride 105 98 - 111 mmol/L   CO2 23 22 - 32 mmol/L   Glucose, Bld 133 (H) 70 - 99 mg/dL   BUN 9 6 - 20 mg/dL   Creatinine, Ser 0.97 0.44 - 1.00 mg/dL   Calcium 8.7 (L) 8.9 - 10.3 mg/dL   GFR, Estimated >60 >60 mL/min   Anion gap 9 5 - 15  CBC with Differential  Result Value Ref Range   WBC 8.7 4.0 - 10.5 K/uL   RBC 2.52 (L) 3.87 - 5.11 MIL/uL   Hemoglobin 5.2 (LL) 12.0 - 15.0 g/dL   HCT 19.2 (L) 36.0 - 46.0 %   MCV 76.2 (L) 80.0 - 100.0 fL   MCH 20.6  (L) 26.0 - 34.0 pg   MCHC 27.1 (L) 30.0 - 36.0 g/dL   RDW 27.7 (H) 11.5 - 15.5 %   Platelets 480 (H) 150 - 400 K/uL   nRBC 1.0 (H) 0.0 - 0.2 %   Neutrophils Relative % 69 %   Neutro Abs 6.1 1.7 - 7.7 K/uL   Lymphocytes Relative 25 %   Lymphs Abs 2.2 0.7 - 4.0 K/uL   Monocytes Relative 4 %   Monocytes Absolute 0.3 0.1 - 1.0 K/uL   Eosinophils Relative 1 %   Eosinophils Absolute 0.0 0.0 - 0.5 K/uL   Basophils Relative 0 %   Basophils Absolute 0.0 0.0 - 0.1 K/uL   Immature Granulocytes 1 %   Abs Immature Granulocytes 0.05 0.00 - 0.07 K/uL   Polychromasia MARKED    Ovalocytes PRESENT   Protime-INR  Result Value Ref Range   Prothrombin Time 13.9 11.4 - 15.2 seconds   INR 1.1 0.8 - 1.2  Pregnancy, urine  Result Value Ref Range   Preg Test,  Ur NEGATIVE NEGATIVE  I-Stat beta hCG blood, ED  Result Value Ref Range   I-stat hCG, quantitative <5.0 <5 mIU/mL   Comment 3          Type and screen Between  Result Value Ref Range   ABO/RH(D) B POS    Antibody Screen NEG    Sample Expiration 05/05/2021,2359    Unit Number [id number]    Blood Component Type RED CELLS,LR    Unit division 00    Status of Unit ISSUED    Transfusion Status OK TO TRANSFUSE    Crossmatch Result Compatible    Unit Number [id number]    Blood Component Type RBC LR PHER2    Unit division 00    Status of Unit ISSUED    Transfusion Status OK TO TRANSFUSE    Crossmatch Result      Compatible Performed at [facility], [place] [address], [place], [place] [address]   Prepare RBC (crossmatch)  Result Value Ref Range   Order Confirmation      ORDER PROCESSED BY BLOOD BANK Performed at [facility], [place] [address], [place], [place] [address]   BP[facility]  Result Value Ref Range   ISSUE DATE / TIME 993570177939    Blood Product Unit Number [id number]    PRODUCT CODE E0382V00    Unit Type and Rh 7300    Blood Product Expiration Date [id number]    ISSUE DATE / TIME  [id number]    Blood Product Unit Number [id number]    PRODUCT CODE C1638G53    Unit Type and Rh 7300    Blood Product Expiration Date 646803212248     MDM  Pt received 2 units PRBCs. On further eval she reports improvement in her lightheadedness and SOB. Discussed case with Dr. Harolyn Rutherford who recommends increasing Aygestin from 5 mg once daily to 10 mg once daily until pt has her myomectomy next month and to follow up with OBGYN at [facility] in the meantime to let them know her aygestin has been increased. Pt feels comfortable with this plan. Stable for discharge home.   This note was prepared using Dragon voice recognition software and may include unintentional dictation errors due to the inherent limitations of voice recognition software.        Eustaquio Maize, PA-C 05/02/21 Ward Chatters    Charlesetta Shanks, MD 05/03/21 1151

## 2021-05-02 NOTE — ED Triage Notes (Signed)
Pt transferred from [place] for transfusion d/t low hgb.

## 2021-05-02 NOTE — ED Triage Notes (Signed)
Pt has history of Fibroids. Started bleeding on Tuesday, worse on Sat. Has surgery scheduled to correct issue next month.

## 2021-05-03 LAB — BPAM RBC
Blood Product Expiration Date: 202208062359
Blood Product Expiration Date: 202208062359
ISSUE DATE / TIME: 202207181344
ISSUE DATE / TIME: 202207181725
Unit Type and Rh: 7300
Unit Type and Rh: 7300

## 2021-05-03 LAB — TYPE AND SCREEN
ABO/RH(D): B POS
Antibody Screen: NEGATIVE
Unit division: 0
Unit division: 0

## 2021-12-18 IMAGING — CT CT ABD-PELV W/ CM
2 of 5 series · 15 of 46 positions shown, 17 images · IV contrast (Omni 300)
Comparison: 06/18/2009

CLINICAL DATA: Right flank pain, fever, UTI

EXAM:
CT ABDOMEN AND PELVIS WITH CONTRAST
TECHNIQUE: Multidetector CT imaging of the abdomen and pelvis was performed
using the standard protocol following bolus administration of
intravenous contrast.
CONTRAST:  100mL OMNIPAQUE IOHEXOL 300 MG/ML  SOLN

[Series 4: thins · axial · 0.76mm/px · z∈[-482,-68]mm · 12 of 974 slices shown, 14 images]
[im 73/974  soft-tissue]
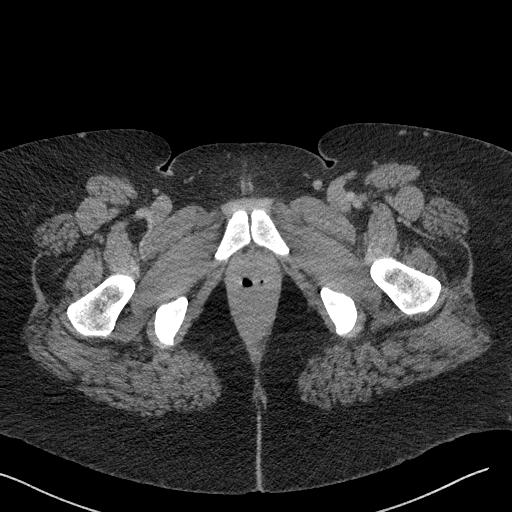
[im 73/974  bone]
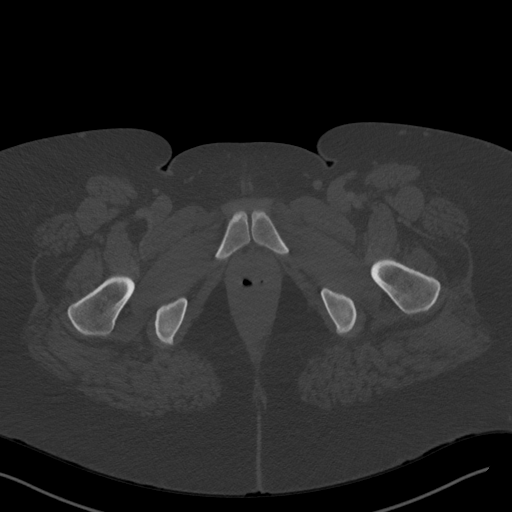
[im 145/974  soft-tissue]
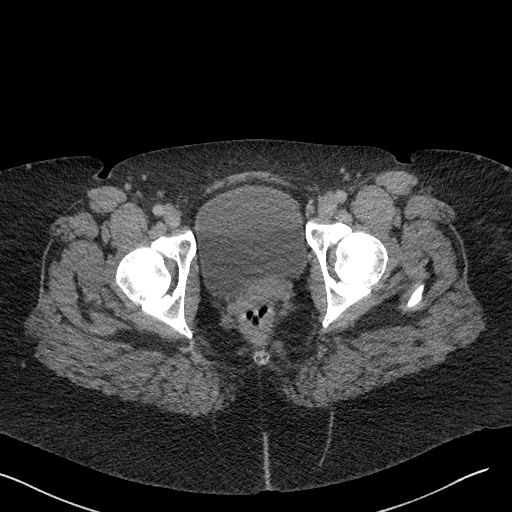
[im 217/974  soft-tissue]
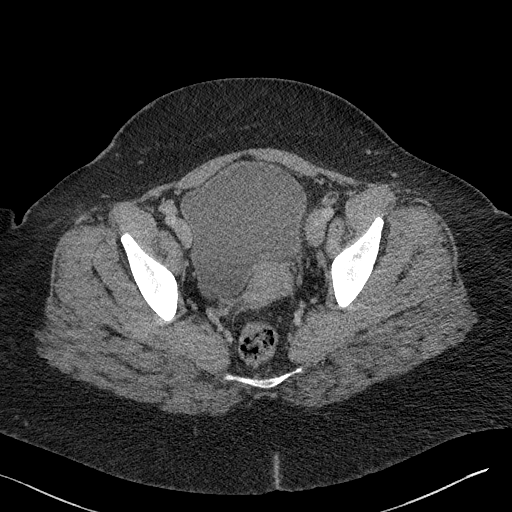
[im 289/974  soft-tissue]
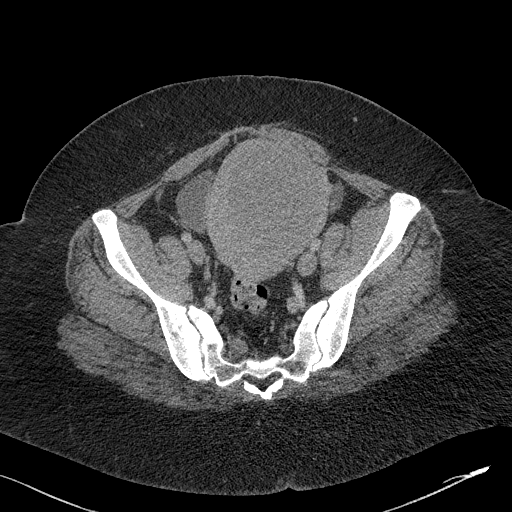
[im 361/974  soft-tissue]
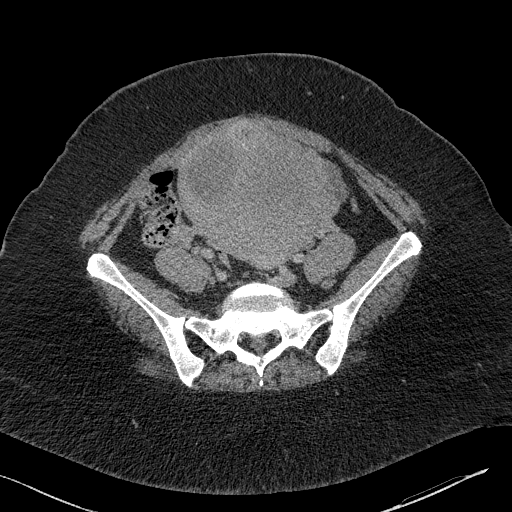
[im 433/974  soft-tissue]
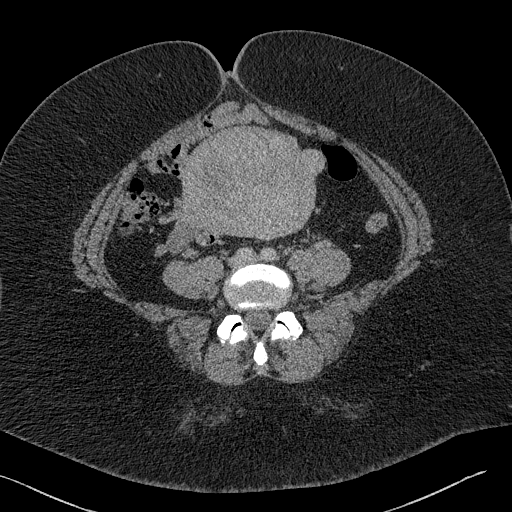
[im 541/974  soft-tissue]
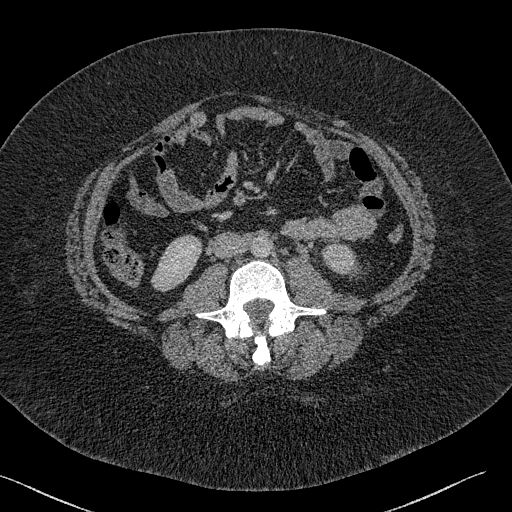
[im 613/974  soft-tissue]
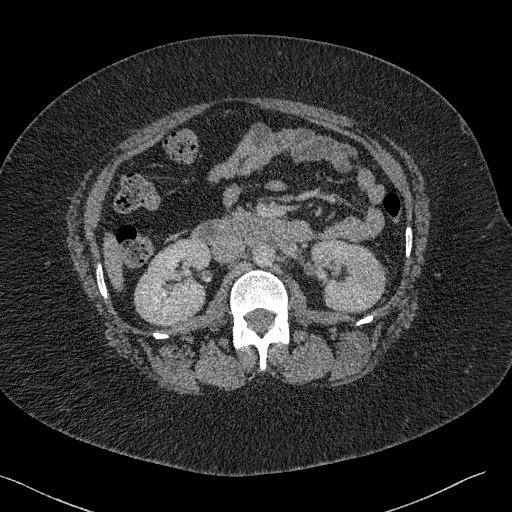
[im 685/974  soft-tissue]
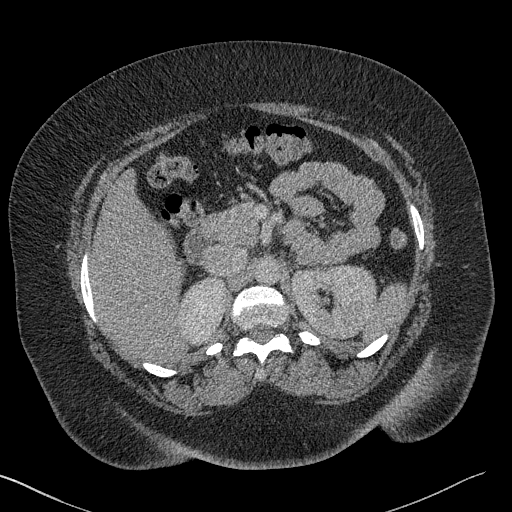
[im 685/974  bone]
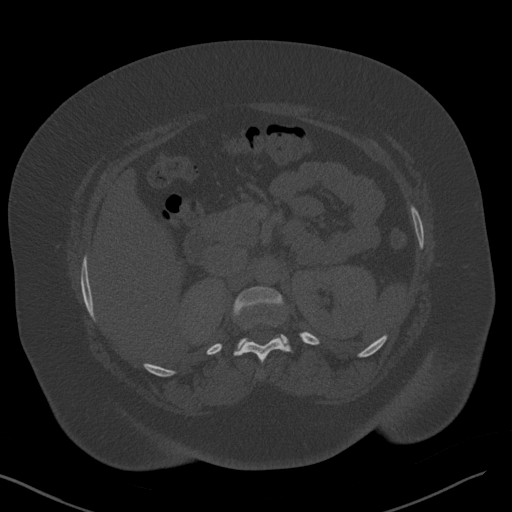
[im 757/974  soft-tissue]
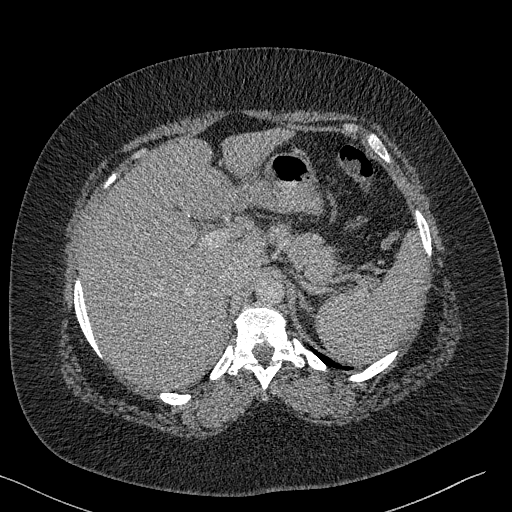
[im 829/974  soft-tissue]
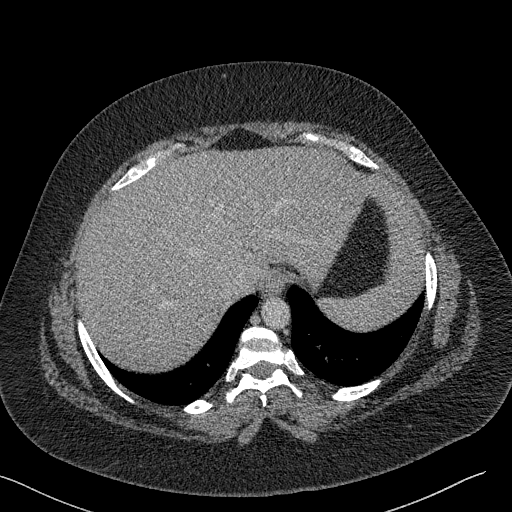
[im 901/974  soft-tissue]
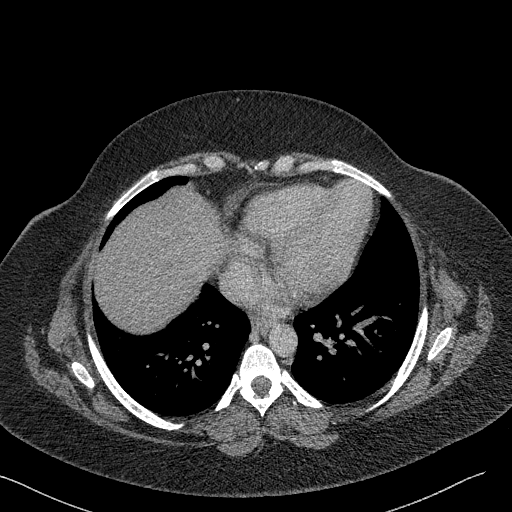

[Series 6: a/p w/ cor · coronal · 0.75mm/px · 3 of 172 slices shown]
[im 58/172  soft-tissue]
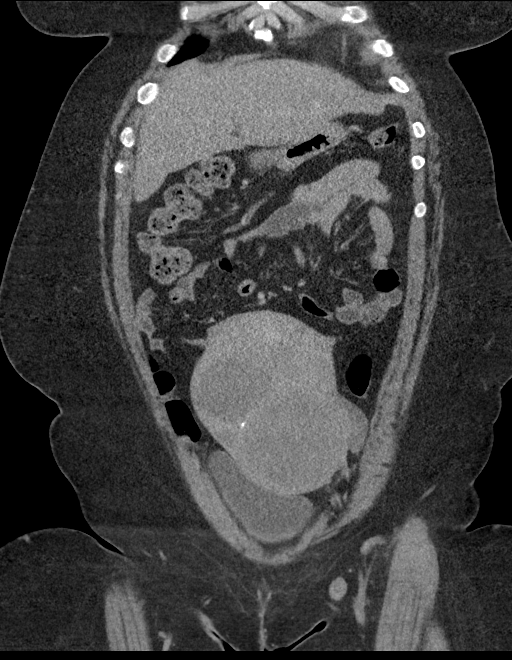
[im 77/172  soft-tissue]
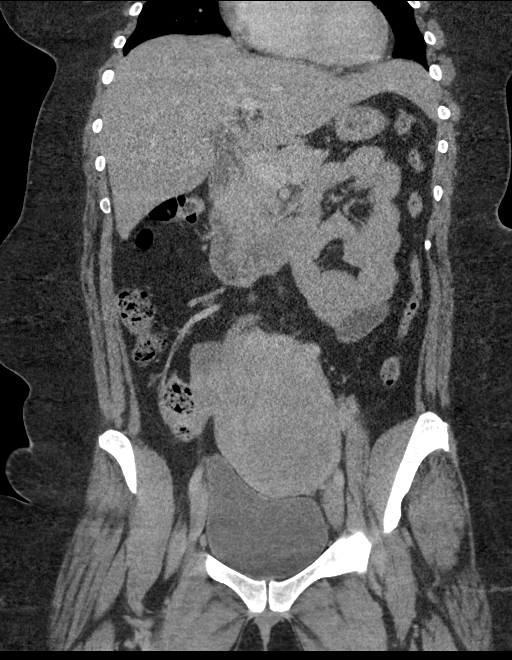
[im 96/172  soft-tissue]
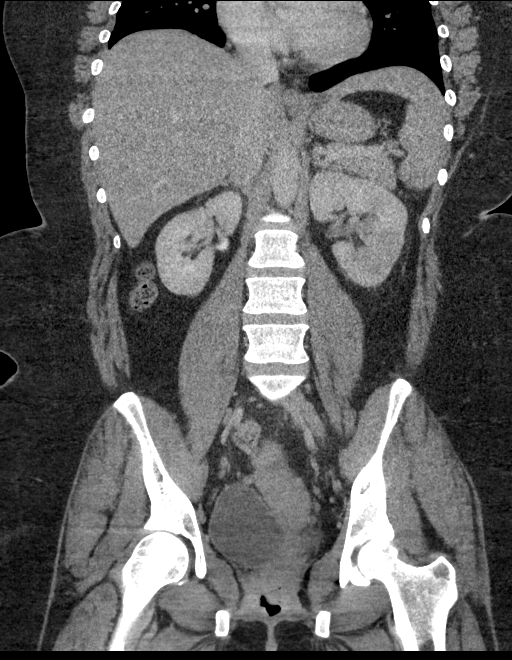

[15 of 46 positions shown; findings below may reference images not displayed]

FINDINGS: Lower chest: 3 mm subpleural nodule at the posterior left lower lobe
(series 5, image 3). Visualized lung bases are otherwise clear. No
acute findings.

Hepatobiliary: Subcentimeter focus of enhancement at the lateral
right hepatic dome, which may reflect a small vascular shunt or
hemangioma (series 3, image 11), and is likely incidental.
Otherwise, no focal liver abnormality is seen. Status post
cholecystectomy. No biliary dilatation.

Pancreas: Unremarkable. No pancreatic ductal dilatation or
surrounding inflammatory changes.

Spleen: Normal in size without focal abnormality.

Adrenals/Urinary Tract: Unremarkable adrenal glands. Delayed left
renal nephrogram with wedge-shaped areas of hypoattenuation (for
example series 6, image 96). Subtle left perinephric stranding.
Right kidney demonstrates normal enhancement. No shadowing stone or
hydronephrosis. Urinary bladder appears unremarkable.

Stomach/Bowel: Stomach is within normal limits. Appendix appears
normal. No evidence of bowel wall thickening, distention, or
inflammatory changes.

Vascular/Lymphatic: No significant vascular findings are present. No
enlarged abdominal or pelvic lymph nodes.

Reproductive: Enlarged uterus containing numerous masses, the
largest measuring approximately 8.0 cm in diameter which is
predominantly low attenuation. There are additional rounded masses
of varying attenuation, some of which are coarsely calcified. No
adnexal masses.

Other: No free fluid or intra-abdominal fluid collection. No
pneumoperitoneum.

Musculoskeletal: No acute or significant osseous findings.
IMPRESSION: 1. Delayed LEFT renal nephrogram with wedge-shaped areas of
hypoattenuation and subtle perinephric stranding. Findings are
concerning for pyelonephritis.
2. Enlarged uterus containing numerous masses, the largest measuring
approximately 8.0 cm in diameter which is predominantly low
attenuation. There are additional rounded masses of varying
attenuation, some of which are coarsely calcified. Findings likely
represent multiple uterine fibroids.
3. 3 mm subpleural nodule at the posterior left lower lobe. No
follow-up needed if patient is low-risk. Non-contrast chest CT can
be considered in 12 months if patient is high-risk. This
recommendation follows the consensus statement: Guidelines for
Management of Incidental Pulmonary Nodules Detected on CT Images:

## 2021-12-22 ENCOUNTER — Ambulatory Visit (HOSPITAL_COMMUNITY)
Admission: EM | Admit: 2021-12-22 | Discharge: 2021-12-22 | Disposition: A | Discharge status: Home / Self Care | Payer: 59 | Attending: Family Medicine | Admitting: Family Medicine

## 2021-12-22 ENCOUNTER — Other Ambulatory Visit: Payer: Self-pay

## 2021-12-22 ENCOUNTER — Encounter (HOSPITAL_COMMUNITY): Payer: Self-pay | Admitting: Emergency Medicine

## 2021-12-22 DIAGNOSIS — K119 Disease of salivary gland, unspecified: Secondary | ICD-10-CM | POA: Diagnosis not present

## 2021-12-22 DIAGNOSIS — R220 Localized swelling, mass and lump, head: Secondary | ICD-10-CM | POA: Diagnosis not present

## 2021-12-22 MED ORDER — AMOXICILLIN-POT CLAVULANATE 875-125 MG PO TABS: 1.0000 | ORAL_TABLET | Freq: Two times a day (BID) | ORAL | 0 refills | Status: AC

## 2021-12-22 NOTE — ED Provider Notes (Signed)
?[facility] ? ? ? ?CSN: [id number] ?Arrival date & time: 12/22/21  1729 ? ? ?  ? ?History   ?Chief Complaint ?Chief Complaint  ?Patient presents with  ? Facial Swelling  ? ? ?HPI ?[name] is a 41 y.o. female.  ? ?HPI ?Here for pain in her left jaw area and near her left ear. ? ?She noted today when she began eating, and it was a sort of stinging burning sensation.  She then also noticed that she had a little swelling around the angle of her left jaw.  It actually is improving.  No fever or chills ? ?She has had this sensation briefly on and off in the last couple of months.  It will happen when she starts to eat and then is her mouth gets used to food and then it stops. ? ?Past Medical History:  ?Diagnosis Date  ? Anemia   ? Fibroids   ? History of blood transfusion   ? "related to fibroids"  ? Iodine hypothyroidism   ? "took radiation iodine pill in 2011  ? Migraines   ? "last one was years ago" (05/20/2014)  ? Thyroid disease   ? ? ?Patient Active Problem List  ? Diagnosis Date Noted  ? Acute on chronic cholecystitis 05/20/2014  ? Dysuria 05/20/2014  ? Hypothyroidism (acquired) 05/20/2014  ? Bilateral low back pain without sciatica 05/14/2014  ? ? ?Past Surgical History:  ?Procedure Laterality Date  ? CHOLECYSTECTOMY N/A 05/20/2014  ? Procedure: LAPAROSCOPIC CHOLECYSTECTOMY WITH INTRAOPERATIVE CHOLANGIOGRAM;  Surgeon: Gayland Curry, MD;  Location: [facility];  Service: General;  Laterality: N/A;  ? KNEE SURGERY    ? LAPAROSCOPIC CHOLECYSTECTOMY  05/20/2014  ? MYOMECTOMY  04/15/2013  ? ? ?OB History   ?No obstetric history on file. ?  ? ? ? ?Home Medications   ? ?Prior to Admission medications   ?Medication Sig Start Date End Date Taking? Authorizing Provider  ?amoxicillin-clavulanate (AUGMENTIN) 875-125 MG tablet Take 1 tablet by mouth 2 (two) times daily for 7 days. 12/22/21 12/29/21 Yes Barrett Henle, MD  ?levothyroxine (SYNTHROID) 175 MCG tablet Take 175 mcg by mouth daily. 03/26/20  Yes [provider]  ?Fe Fum-FePoly-Vit C-Vit B3 (INTEGRA) 62.5-62.5-40-3 MG CAPS Take 1 capsule by mouth every evening.  03/29/20   [provider]  ?ferrous sulfate 325 (65 FE) MG tablet Take 1 tablet (325 mg total) by mouth daily. 05/24/20   Loura Halt A, NP  ?naproxen (NAPROSYN) 500 MG tablet Take 1 tablet (500 mg total) by mouth 2 (two) times daily with a meal. 06/27/20   Jaynee Eagles, PA-C  ?naproxen sodium (ALEVE) 220 MG tablet Take 220 mg by mouth.    [provider]  ?norethindrone (AYGESTIN) 5 MG tablet Take 2 tablets (10 mg total) by mouth daily. 05/02/21   Eustaquio Maize, PA-C  ?ondansetron (ZOFRAN ODT) 4 MG disintegrating tablet Take 1 tablet (4 mg total) by mouth every 8 (eight) hours as needed for nausea or vomiting. 05/24/20   Bast, Traci A, NP  ?YASMIN 28 3-0.03 MG tablet Take 1 tablet by mouth daily. 03/26/20   [provider]  ?fluticasone (FLONASE) 50 MCG/ACT nasal spray Place 2 sprays into both nostrils daily. ?Patient not taking: Reported on 03/27/2020 10/06/18 03/27/20  Ok Edwards, PA-C  ?ipratropium (ATROVENT) 0.06 % nasal spray Place 2 sprays into both nostrils 4 (four) times daily. ?Patient not taking: Reported on 03/27/2020 10/06/18 03/27/20  Ok Edwards, PA-C  ? ? ?  Family History ?Family History  ?Problem Relation Age of Onset  ? Lupus Mother   ? Diabetes Father   ? ? ?Social History ?Social History  ? ?Tobacco Use  ? Smoking status: Never  ? Smokeless tobacco: Never  ?Vaping Use  ? Vaping Use: Never used  ?Substance Use Topics  ? Alcohol use: Yes  ?  Comment: social  ? Drug use: No  ? ? ? ?Allergies   ?Patient has no known allergies. ? ? ?Review of Systems ?Review of Systems ? ? ?Physical Exam ?Triage Vital Signs ?ED Triage Vitals  ?Enc Vitals Group  ?   BP 12/22/21 1810 135/86  ?   Pulse Rate 12/22/21 1807 81  ?   Resp 12/22/21 1807 16  ?   Temp 12/22/21 1807 98.4 ?F (36.9 ?C)  ?   Temp Source 12/22/21 1807 Oral  ?   SpO2 12/22/21 1807 100 %  ?   Weight --   ?   Height --   ?    Head Circumference --   ?   Peak Flow --   ?   Pain Score 12/22/21 1806 2  ?   Pain Loc --   ?   Pain Edu? --   ?   Excl. in [place]? --   ? ?No data found. ? ?Updated Vital Signs ?BP 135/86   Pulse 81   Temp 98.4 ?F (36.9 ?C) (Oral)   Resp 16   LMP 11/24/2021   SpO2 100%  ? ?Visual Acuity ?Right Eye Distance:   ?Left Eye Distance:   ?Bilateral Distance:   ? ?Right Eye Near:   ?Left Eye Near:    ?Bilateral Near:    ? ?Physical Exam ?Vitals reviewed.  ?Constitutional:   ?   General: She is not in acute distress. ?   Appearance: She is not toxic-appearing.  ?HENT:  ?   Head:  ?   Comments: There is very slight swelling on her left cheek near the angle of her left jaw.  It is not indurated or red.  It is mildly tender.  The temporomandibular joint is not tender ?   Right Ear: Tympanic membrane and ear canal normal.  ?   Left Ear: Tympanic membrane and ear canal normal.  ?   Nose: Nose normal.  ?   Mouth/Throat:  ?   Mouth: Mucous membranes are moist.  ?   Pharynx: No oropharyngeal exudate or posterior oropharyngeal erythema.  ?   Comments: No swelling in the mouth ?Eyes:  ?   Extraocular Movements: Extraocular movements intact.  ?   Conjunctiva/sclera: Conjunctivae normal.  ?   Pupils: Pupils are equal, round, and reactive to light.  ?Cardiovascular:  ?   Rate and Rhythm: Normal rate and regular rhythm.  ?   Heart sounds: No murmur heard. ?Pulmonary:  ?   Effort: Pulmonary effort is normal.  ?   Breath sounds: Normal breath sounds.  ?Musculoskeletal:  ?   Cervical back: Neck supple.  ?Lymphadenopathy:  ?   Cervical: No cervical adenopathy.  ?Skin: ?   Capillary Refill: Capillary refill takes less than 2 seconds.  ?   Coloration: Skin is not jaundiced or pale.  ?Neurological:  ?   General: No focal deficit present.  ?   Mental Status: She is alert and oriented to person, place, and time.  ?Psychiatric:     ?   Behavior: Behavior normal.  ? ? ? ?UC Treatments / Results  ?Labs ?(all labs  ordered are listed, but only  abnormal results are displayed) ?Labs Reviewed - No data to display ? ?EKG ? ? ?Radiology ?No results found. ? ?Procedures ?Procedures (including critical care time) ? ?Medications Ordered in UC ?Medications - No data to display ? ?Initial Impression / Assessment and Plan / UC Course  ?I have reviewed the triage vital signs and the nursing notes. ? ?Pertinent labs & imaging results that were available during my care of the patient were reviewed by me and considered in my medical decision making (see chart for details). ? ?  ? ?Discussed that I think this is most likely a salivary gland related problem, and possibly a stone ? ?I have printed a prescription for an antibiotic, and if her swelling is not continuing to improve in the next 24 hours she will fill that and start it. ?Final Clinical Impressions(s) / UC Diagnoses  ? ?Final diagnoses:  ?Left facial swelling  ?Salivary gland disorder  ? ? ? ?Discharge Instructions   ? ?  ?If your swelling is not going all the way away in the next 24 hours or the pain is continuing, then fill the prescription for amoxicillin/clavulanic and you would take it 1 tab twice daily with food for 7 days. ? ? ? ? ?ED Prescriptions   ? ? Medication Sig Dispense Auth. Provider  ? amoxicillin-clavulanate (AUGMENTIN) 875-125 MG tablet Take 1 tablet by mouth 2 (two) times daily for 7 days. 14 tablet Slevin Gunby, Gwenlyn Perking, MD  ? ?  ? ?PDMP not reviewed this encounter. ?  ?Barrett Henle, MD ?12/22/21 1828 ? ?

## 2021-12-22 NOTE — Discharge Instructions (Signed)
If your swelling is not going all the way away in the next 24 hours or the pain is continuing, then fill the prescription for amoxicillin/clavulanic and you would take it 1 tab twice daily with food for 7 days. ?

## 2021-12-22 NOTE — ED Triage Notes (Signed)
PT reports pain and swelling in left jaw / face.  ?Pain increases when she puts something in her mouth to eat.  ? ?This has been intermittent for months  ?

## 2022-11-30 ENCOUNTER — Encounter (HOSPITAL_BASED_OUTPATIENT_CLINIC_OR_DEPARTMENT_OTHER): Payer: Self-pay

## 2022-11-30 ENCOUNTER — Emergency Department (HOSPITAL_BASED_OUTPATIENT_CLINIC_OR_DEPARTMENT_OTHER)
Admission: EM | Admit: 2022-11-30 | Discharge: 2022-11-30 | Disposition: A | Discharge status: Home / Self Care | Payer: 59 | Attending: Emergency Medicine | Admitting: Emergency Medicine

## 2022-11-30 ENCOUNTER — Other Ambulatory Visit: Payer: Self-pay

## 2022-11-30 ENCOUNTER — Emergency Department (HOSPITAL_BASED_OUTPATIENT_CLINIC_OR_DEPARTMENT_OTHER): Payer: 59 | Admitting: Radiology

## 2022-11-30 DIAGNOSIS — M79671 Pain in right foot: Secondary | ICD-10-CM | POA: Diagnosis not present

## 2022-11-30 DIAGNOSIS — R079 Chest pain, unspecified: Secondary | ICD-10-CM

## 2022-11-30 DIAGNOSIS — R0602 Shortness of breath: Secondary | ICD-10-CM | POA: Diagnosis not present

## 2022-11-30 DIAGNOSIS — Z20822 Contact with and (suspected) exposure to covid-19: Secondary | ICD-10-CM | POA: Diagnosis not present

## 2022-11-30 DIAGNOSIS — R059 Cough, unspecified: Secondary | ICD-10-CM | POA: Diagnosis not present

## 2022-11-30 DIAGNOSIS — R0981 Nasal congestion: Secondary | ICD-10-CM | POA: Insufficient documentation

## 2022-11-30 LAB — CBC
HCT: 32.6 % — ABNORMAL LOW (ref 36.0–46.0)
Hemoglobin: 9.9 g/dL — ABNORMAL LOW (ref 12.0–15.0)
MCH: 21.2 pg — ABNORMAL LOW (ref 26.0–34.0)
MCHC: 30.4 g/dL (ref 30.0–36.0)
MCV: 69.8 fL — ABNORMAL LOW (ref 80.0–100.0)
Platelets: 317 10*3/uL (ref 150–400)
RBC: 4.67 MIL/uL (ref 3.87–5.11)
RDW: 20.2 % — ABNORMAL HIGH (ref 11.5–15.5)
WBC: 7.8 10*3/uL (ref 4.0–10.5)
nRBC: 0 % (ref 0.0–0.2)

## 2022-11-30 LAB — BASIC METABOLIC PANEL
Anion gap: 10 (ref 5–15)
BUN: 15 mg/dL (ref 6–20)
CO2: 24 mmol/L (ref 22–32)
Calcium: 9.9 mg/dL (ref 8.9–10.3)
Chloride: 102 mmol/L (ref 98–111)
Creatinine, Ser: 0.79 mg/dL (ref 0.44–1.00)
GFR, Estimated: >60 mL/min (ref >60)
Glucose, Bld: 95 mg/dL (ref 70–99)
Potassium: 3.6 mmol/L (ref 3.5–5.1)
Sodium: 136 mmol/L (ref 135–145)

## 2022-11-30 LAB — RESP PANEL BY RT-PCR (RSV, FLU A&B, COVID)  RVPGX2
Influenza A by PCR: NEGATIVE
Influenza B by PCR: NEGATIVE
Resp Syncytial Virus by PCR: NEGATIVE
SARS Coronavirus 2 by RT PCR: NEGATIVE

## 2022-11-30 LAB — TROPONIN I (HIGH SENSITIVITY)
Troponin I (High Sensitivity): 2 ng/L (ref <18)
Troponin I (High Sensitivity): 3 ng/L (ref <18)

## 2022-11-30 LAB — D-DIMER, QUANTITATIVE: D-Dimer, Quant: 0.42 ug/mL-FEU (ref 0.00–0.50)

## 2022-11-30 NOTE — ED Triage Notes (Signed)
Patient here POV from Home.  Endorses Right Leg Pain that began Intermittently 1-2 Weeks ago and then began to have CP that began Yesterday. Mostly Painful with inspiration and Mostly in Center Chest.   No SOB.   NAD Noted during Triage. A&Ox4. GCS 15. Ambulatory.

## 2022-11-30 NOTE — ED Notes (Signed)
RN reviewed discharge instructions with pt. Pt verbalized understanding and had no further questions. VSS upon discharge.  

## 2022-11-30 NOTE — ED Provider Notes (Signed)
[facility] Provider Note   CSN: [id number] Arrival date & time: 11/30/22  1730     History  Chief Complaint  Patient presents with   Chest Pain    [name] is a 42 y.o. female.  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  Says that on Tuesday started experiencing chest pain that was pleuritic and dull.  In her left upper chest and has started to become substernal and radiating to her back.  Not exertional.  Also has mild cough and congestion.  Also with mild shortness of breath.  Not on hormones (aside from levothyroxine) or OCPs.  No surgeries in the past month.  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  No personal history of MI or immediate family members or history of MI.  Also reports atraumatic right foot pain but no swelling that started several days ago       Home Medications Prior to Admission medications   Medication Sig Start Date End Date Taking? Authorizing Provider  Fe Fum-FePoly-Vit C-Vit B3 (INTEGRA) 62.5-62.5-40-3 MG CAPS Take 1 capsule by mouth every evening.  03/29/20   [provider]  ferrous sulfate 325 (65 FE) MG tablet Take 1 tablet (325 mg total) by mouth daily. 05/24/20   Loura Halt A, NP  levothyroxine (SYNTHROID) 175 MCG tablet Take 175 mcg by mouth daily. 03/26/20   [provider]  naproxen (NAPROSYN) 500 MG tablet Take 1 tablet (500 mg total) by mouth 2 (two) times daily with a meal. 06/27/20   Jaynee Eagles, PA-C  naproxen sodium (ALEVE) 220 MG tablet Take 220 mg by mouth.    [provider]  norethindrone (AYGESTIN) 5 MG tablet Take 2 tablets (10 mg total) by mouth daily. 05/02/21   Alroy Bailiff, Margaux, PA-C  ondansetron (ZOFRAN ODT) 4 MG disintegrating tablet Take 1 tablet (4 mg total) by mouth every 8 (eight) hours as needed for nausea or  vomiting. 05/24/20   Bast, Traci A, NP  YASMIN 28 3-0.03 MG tablet Take 1 tablet by mouth daily. 03/26/20   [provider]  fluticasone (FLONASE) 50 MCG/ACT nasal spray Place 2 sprays into both nostrils daily. Patient not taking: Reported on 03/27/2020 10/06/18 03/27/20  Ok Edwards, PA-C  ipratropium (ATROVENT) 0.06 % nasal spray Place 2 sprays into both nostrils 4 (four) times daily. Patient not taking: Reported on 03/27/2020 10/06/18 03/27/20  Ok Edwards, PA-C      Allergies    Patient has no known allergies.    Review of Systems   Review of Systems  Physical Exam Updated Vital Signs BP (!) 143/91   Pulse 89   Temp 98.6 F (37 C) (Oral)   Resp 18   Ht 5' 7"$  (1.702 m)   Wt 127 kg   LMP 11/20/2022 (Approximate)   SpO2 100%   BMI 43.85 kg/m  Physical Exam Vitals and nursing note reviewed.  Constitutional:      General: She is not in acute distress.    Appearance: She is well-developed.  HENT:     Head: Normocephalic and atraumatic.     Right Ear: External ear normal.     Left Ear: External ear normal.     Nose: Nose normal.  Eyes:     Extraocular Movements: Extraocular movements intact.     Conjunctiva/sclera: Conjunctivae normal.  Pupils: Pupils are equal, round, and reactive to light.  Cardiovascular:     Rate and Rhythm: Normal rate and regular rhythm.     Heart sounds: No murmur heard.    Comments: Chest pain not reproducible Pulmonary:     Effort: Pulmonary effort is normal. No respiratory distress.     Breath sounds: Normal breath sounds.  Abdominal:     General: Abdomen is flat. There is no distension.     Palpations: Abdomen is soft. There is no mass.     Tenderness: There is no abdominal tenderness. There is no guarding.  Musculoskeletal:     Cervical back: Normal range of motion and neck supple.     Right lower leg: No edema.     Left lower leg: No edema.  Skin:    General: Skin is warm and dry.  Neurological:     Mental Status: She is alert and  oriented to person, place, and time. Mental status is at baseline.  Psychiatric:        Mood and Affect: Mood normal.     ED Results / Procedures / Treatments   Labs (all labs ordered are listed, but only abnormal results are displayed) Labs Reviewed  CBC - Abnormal; Notable for the following components:      Result Value   Hemoglobin 9.9 (*)    HCT 32.6 (*)    MCV 69.8 (*)    MCH 21.2 (*)    RDW 20.2 (*)    All other components within normal limits  RESP PANEL BY RT-PCR (RSV, FLU A&B, COVID)  RVPGX2  BASIC METABOLIC PANEL  D-DIMER, QUANTITATIVE  TROPONIN I (HIGH SENSITIVITY)  TROPONIN I (HIGH SENSITIVITY)    EKG EKG Interpretation  Date/Time:  Thursday November 30 2022 18:05:20 EST Ventricular Rate:  88 PR Interval:  144 QRS Duration: 68 QT Interval:  334 QTC Calculation: 404 R Axis:   74 Text Interpretation: Normal sinus rhythm Normal ECG When compared with ECG of 26-Apr-2018 16:24, No significant change was found Confirmed by Margaretmary Eddy [phone]) on 11/30/2022 8:01:34 PM  Radiology DG Chest 2 View  Result Date: 11/30/2022 CLINICAL DATA:  Chest pain EXAM: CHEST - 2 VIEW COMPARISON:  04/04/2015 FINDINGS: The heart size and mediastinal contours are within normal limits. Both lungs are clear. The visualized skeletal structures are unremarkable. IMPRESSION: No acute cardiopulmonary disease Electronically Signed   By: Jill Side M.D.   On: 11/30/2022 18:38    Procedures Procedures   Medications Ordered in ED Medications - No data to display  ED Course/ Medical Decision Making/ A&P                             Medical Decision Making Amount and/or Complexity of Data Reviewed Labs: ordered. Radiology: ordered.   [name] is a 42 y.o. female with comorbidities that complicate the patient evaluation including history of chronic anemia due to vaginal bleeding and fibroids presents emergency department chest pain.   Initial Ddx:  PE, MI, symptomatic  anemia, URI, pleurisy  MDM:  With the patient's respiratory symptoms and pleuritic chest pain with right foot pain concern for possible DVT/PE.  Will obtain D-dimer at this time.  Patient does not appear to be clearly anginal but will obtain EKG and troponins rule out MI.  Will get CBC to evaluate if patient is anemic.  With her URI symptoms we will also obtain COVID and flu.  Plan:  Labs Troponin D-dimer COVID/flu Chest x-ray EKG  ED Summary/Re-evaluation:  Patient underwent the above workup that was reassuring.  Was anemic but her hemoglobin is at her baseline so feel that symptomatic anemia is not likely.  Feel that her symptoms may likely be due to her URI pleurisy.  Instructed on symptomatic care and informed that she should follow-up with her primary doctor in several days.  This patient presents to the ED for concern of complaints listed in HPI, this involves an extensive number of treatment options, and is a complaint that carries with it a high risk of complications and morbidity. Disposition including potential need for admission considered.   Dispo: DC Home. Return precautions discussed including, but not limited to, those listed in the AVS. Allowed pt time to ask questions which were answered fully prior to dc.  Records reviewed Outpatient Clinic Notes The following labs were independently interpreted: Chemistry and Serial Troponins and show no acute abnormality I independently reviewed the following imaging with scope of interpretation limited to determining acute life threatening conditions related to emergency care: Chest x-ray and agree with the radiologist interpretation with the following exceptions: None I personally reviewed and interpreted cardiac monitoring: normal sinus rhythm  I personally reviewed and interpreted the pt's EKG: see above for interpretation  I have reviewed the patients home medications and made adjustments as needed  Final Clinical Impression(s) /  ED Diagnoses Final diagnoses:  Chest pain, unspecified type    Rx / DC Orders ED Discharge Orders     None         Fransico Meadow, MD 12/01/22 [phone]

## 2022-11-30 NOTE — Discharge Instructions (Signed)
You were seen for your chest pain in the emergency department.   At home, please take Tylenol and ibuprofen for your pain.    Follow-up with your primary doctor in 2-3 days regarding your visit.    Return immediately to the emergency department if you experience any of the following: Worsening pain, difficulty breathing, or any other concerning symptoms.    Thank you for visiting our Emergency Department. It was a pleasure taking care of you today.

## 2023-01-02 ENCOUNTER — Ambulatory Visit: Payer: 59 | Admitting: Podiatry

## 2023-01-03 ENCOUNTER — Ambulatory Visit: Payer: 59 | Admitting: Podiatry

## 2023-01-03 ENCOUNTER — Ambulatory Visit (INDEPENDENT_AMBULATORY_CARE_PROVIDER_SITE_OTHER): Payer: 59

## 2023-01-03 DIAGNOSIS — M722 Plantar fascial fibromatosis: Secondary | ICD-10-CM

## 2023-01-03 DIAGNOSIS — M79671 Pain in right foot: Secondary | ICD-10-CM

## 2023-01-03 MED ORDER — DICLOFENAC SODIUM 75 MG PO TBEC: 75.0000 mg | DELAYED_RELEASE_TABLET | Freq: Two times a day (BID) | ORAL | 2 refills | Status: DC

## 2023-01-03 MED ORDER — TRIAMCINOLONE ACETONIDE 10 MG/ML IJ SUSP
10.0000 mg | Freq: Once | INTRAMUSCULAR | Status: AC
  Administered 2023-01-03: 10 mg

## 2023-01-03 NOTE — Progress Notes (Signed)
Subjective:   Patient ID: [name], female   DOB: 42 y.o.   MRN: [id number]   HPI Patient presents stating she has had a lot of pain in her right arch and it has been going on for several months does not remember injury.  States that it tingles at times and that she has not tried any treatment does not smoke likes to be active   Review of Systems  All other systems reviewed and are negative.       Objective:  Physical Exam Vitals and nursing note reviewed.  Constitutional:      Appearance: She is well-developed.  Pulmonary:     Effort: Pulmonary effort is normal.  Musculoskeletal:        General: Normal range of motion.  Skin:    General: Skin is warm.  Neurological:     Mental Status: She is alert.     Neurovascular status intact muscle strength adequate range of motion adequate with exquisite discomfort in the mid arch right with inflammation fluid with that involving the center portion of the tendon center lateral center medial.  Good digital perfusion well-oriented     Assessment:  Acute mid arch plantar fasciitis right fluid buildup around the tendon in this area     Plan:  H&P reviewed and went ahead today did sterile prep and injected the mid arch area right 3 mg Kenalog 5 mg Xylocaine applied fascial brace to lift up the arch properly fitted into her arch gave instructions for supportive shoes ice therapy and reappoint to recheck again in the next 3 weeks  X-rays were negative for signs of calcification or bony injury associated with pain moderate depression of the arch

## 2023-01-08 ENCOUNTER — Other Ambulatory Visit: Payer: Self-pay | Admitting: Podiatry

## 2023-01-08 DIAGNOSIS — M722 Plantar fascial fibromatosis: Secondary | ICD-10-CM

## 2023-01-08 DIAGNOSIS — M79671 Pain in right foot: Secondary | ICD-10-CM

## 2023-01-17 ENCOUNTER — Encounter: Payer: Self-pay | Admitting: Podiatry

## 2023-01-17 ENCOUNTER — Ambulatory Visit: Payer: 59 | Admitting: Podiatry

## 2023-01-17 DIAGNOSIS — M722 Plantar fascial fibromatosis: Secondary | ICD-10-CM

## 2023-01-17 NOTE — Progress Notes (Signed)
Subjective:   Patient ID: [name], female   DOB: 41 y.o.   MRN: [id number]   HPI Patient states she is feeling a lot better has significant reduction of pain in her right foot   ROS      Objective:  Physical Exam  Mid arch plantar fasciitis is improved quite dramatically minimal discomfort she does walk with a good heel toe gait still using the brace     Assessment:  Planter fasciitis right that is done quite well with conservative care     Plan:  H&P reviewed recommended continued stretching exercises continued brace usage for the next few weeks and anti-inflammatories to be finished.  Instructed on shoe gear modification will be seen back as symptoms indicate

## 2023-05-20 ENCOUNTER — Encounter (HOSPITAL_COMMUNITY): Payer: Self-pay | Admitting: Emergency Medicine

## 2023-05-20 ENCOUNTER — Other Ambulatory Visit: Payer: Self-pay

## 2023-05-20 ENCOUNTER — Ambulatory Visit (HOSPITAL_COMMUNITY)
Admission: EM | Admit: 2023-05-20 | Discharge: 2023-05-20 | Disposition: A | Discharge status: Home / Self Care | Payer: 59 | Attending: Family | Admitting: Family

## 2023-05-20 DIAGNOSIS — R030 Elevated blood-pressure reading, without diagnosis of hypertension: Secondary | ICD-10-CM | POA: Insufficient documentation

## 2023-05-20 DIAGNOSIS — J069 Acute upper respiratory infection, unspecified: Secondary | ICD-10-CM | POA: Insufficient documentation

## 2023-05-20 DIAGNOSIS — B9789 Other viral agents as the cause of diseases classified elsewhere: Secondary | ICD-10-CM | POA: Diagnosis not present

## 2023-05-20 DIAGNOSIS — J00 Acute nasopharyngitis [common cold]: Secondary | ICD-10-CM

## 2023-05-20 DIAGNOSIS — U071 COVID-19: Secondary | ICD-10-CM | POA: Diagnosis not present

## 2023-05-20 NOTE — ED Triage Notes (Signed)
Pt c/o a sinus infection for the past few days not getting any better with Tylenol and Ibuprofen.

## 2023-05-20 NOTE — Discharge Instructions (Signed)
-   Drink enough fluids and have enough rest -COVID test result will not be available until after 24 hours -Take Tylenol or ibuprofen for symptoms -For new or worsening symptoms call your primary care provider and schedule an appointment or go to the emergency room -We offered treatment for high blood pressure readings, you elected to follow up with your primary care provider and endocrinologist. -Call your primary care provider tomorrow and schedule an appointment for further management of high blood pressure readings.

## 2023-05-20 NOTE — ED Provider Notes (Signed)
[facility]    CSN: [id number] Arrival date & time: 05/20/23  1005      History   Chief Complaint Chief Complaint  Patient presents with   URI    Pt c/o a sinus infection for the past few days not getting any better with Tylenol and Ibuprofen.    HPI [name] is a 42 y.o. female who presented to the urgent care this morning with complaints of "sinus infection."  Patient reports she has been around coworkers who have been sick lately, her symptoms which include runny nose started about a week ago.  She did take COVID-19 test at home yesterday, which was negative.  Patient reports symptoms worsened 2 days ago.  Patient denies fever, chills, headache, chest pain, shortness of breath or cough today.  Patient reports history of hypothyroidism, anemia and fibroids.  Has upcoming appointment with her endocrinologist in the next few months.  Her blood pressure readings are high this morning, patient reports normal reading at her primary care provider last week of 128/84.  Blood pressure reading was 154/96 with pulse of 101.  Patient reports she is not drinking enough water.  She reports not ready at this time for high blood pressure treatment.  She is going to see her primary care provider soon.  Reports clear runny nose, no sinus tenderness at this time.  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   Migraines    "last one was years ago" (05/20/2014)   Thyroid disease     Patient Active Problem List   Diagnosis Date Noted   Acute on chronic cholecystitis 05/20/2014   Dysuria 05/20/2014   Hypothyroidism (acquired) 05/20/2014   Bilateral low back pain without sciatica 05/14/2014    Past Surgical History:  Procedure Laterality Date   CHOLECYSTECTOMY N/A 05/20/2014   Procedure: LAPAROSCOPIC CHOLECYSTECTOMY WITH INTRAOPERATIVE  CHOLANGIOGRAM;  Surgeon: Atilano Ina, MD;  Location: [facility] OR;  Service: General;  Laterality: N/A;   KNEE SURGERY     LAPAROSCOPIC CHOLECYSTECTOMY  05/20/2014   MYOMECTOMY  04/15/2013    OB History   No obstetric history on file.      Home Medications    Prior to Admission medications   Medication Sig Start Date End Date Taking? Authorizing Provider  diclofenac (VOLTAREN) 75 MG EC tablet Take 1 tablet (75 mg total) by mouth 2 (two) times daily. 01/03/23   Lenn Sink, DPM  Fe Fum-FePoly-Vit C-Vit B3 (INTEGRA) 62.5-62.5-40-3 MG CAPS Take 1 capsule by mouth every evening.  03/29/20   [provider]  ferrous sulfate 325 (65 FE) MG tablet Take 1 tablet (325 mg total) by mouth daily. 05/24/20   Dahlia Byes A, NP  levothyroxine (SYNTHROID) 175 MCG tablet Take 175 mcg by mouth daily. 03/26/20   [provider]  naproxen (NAPROSYN) 500 MG tablet Take 1 tablet (500 mg total) by mouth 2 (two) times daily with a meal. 06/27/20   Wallis Bamberg, PA-C  naproxen sodium (ALEVE) 220 MG tablet Take 220 mg by mouth.    [provider]  norethindrone (AYGESTIN) 5 MG tablet Take 2 tablets (10 mg total) by mouth daily. 05/02/21   Hyman Hopes, Margaux, PA-C  ondansetron (ZOFRAN ODT) 4 MG disintegrating tablet Take 1 tablet (4 mg total) by mouth every 8 (eight) hours as  needed for nausea or vomiting. 05/24/20   Bast, Traci A, NP  YASMIN 28 3-0.03 MG tablet Take 1 tablet by mouth daily. 03/26/20   [provider]  fluticasone (FLONASE) 50 MCG/ACT nasal spray Place 2 sprays into both nostrils daily. Patient not taking: Reported on 03/27/2020 10/06/18 03/27/20  Belinda Fisher, PA-C  ipratropium (ATROVENT) 0.06 % nasal spray Place 2 sprays into both nostrils 4 (four) times daily. Patient not taking: Reported on 03/27/2020 10/06/18 03/27/20  Lurline Idol    Family History Family History  Problem Relation Age of Onset   Lupus Mother    Diabetes Father     Social History Social History    Tobacco Use   Smoking status: Never   Smokeless tobacco: Never  Vaping Use   Vaping status: Never Used  Substance Use Topics   Alcohol use: Yes    Comment: social   Drug use: No     Allergies   Patient has no known allergies.   Review of Systems Review of Systems  Constitutional: Negative.   HENT:  Positive for rhinorrhea, sinus pressure, sneezing and sore throat. Negative for sinus pain.   Eyes: Negative.   Respiratory:  Negative for cough, chest tightness, shortness of breath, wheezing and stridor.   Cardiovascular: Negative.   Gastrointestinal: Negative.   Genitourinary: Negative.   Musculoskeletal: Negative.   Neurological: Negative.      Physical Exam Triage Vital Signs ED Triage Vitals [05/20/23 1025]  Encounter Vitals Group     BP (!) 154/96     Systolic BP Percentile      Diastolic BP Percentile      Pulse Rate (!) 101     Resp 18     Temp 98.3 F (36.8 C)     Temp Source Oral     SpO2 98 %     Weight 280 lb (127 kg)     Height 5\' 7"  (1.702 m)     Head Circumference      Peak Flow      Pain Score 0     Pain Loc      Pain Education      Exclude from Growth Chart    No data found.  Updated Vital Signs BP (!) 150/92   Pulse (!) 101   Temp 98.3 F (36.8 C) (Oral)   Resp 18   Ht 5\' 7"  (1.702 m)   Wt 280 lb (127 kg)   SpO2 98%   BMI 43.85 kg/m   Visual Acuity Right Eye Distance:   Left Eye Distance:   Bilateral Distance:    Right Eye Near:   Left Eye Near:    Bilateral Near:     Physical Exam Constitutional:      Appearance: Normal appearance.  HENT:     Head: Normocephalic and atraumatic.     Right Ear: Tympanic membrane normal.     Left Ear: Tympanic membrane normal.     Nose: Congestion present.     Mouth/Throat:     Mouth: Mucous membranes are dry.  Eyes:     Extraocular Movements: Extraocular movements intact.     Pupils: Pupils are equal, round, and reactive to light.  Cardiovascular:     Rate and Rhythm: Tachycardia  present.  Pulmonary:     Effort: Pulmonary effort is normal.     Breath sounds: Normal breath sounds.  Abdominal:     General: Bowel sounds are normal.  Musculoskeletal:  Cervical back: Neck supple.  Neurological:     Mental Status: She is alert and oriented to person, place, and time.  Psychiatric:        Behavior: Behavior normal.      UC Treatments / Results  Labs (all labs ordered are listed, but only abnormal results are displayed) Labs Reviewed  SARS CORONAVIRUS 2 (TAT 6-24 HRS)    EKG   Radiology No results found.  Procedures Procedures (including critical care time)  Medications Ordered in UC Medications - No data to display  Initial Impression / Assessment and Plan / UC Course  I have reviewed the triage vital signs and the nursing notes. Pertinent labs & imaging results that were available during my care of the patient were reviewed by me and considered in my medical decision making (see chart for details).  Assessment: Patient is a 42 year old female with upper respiratory symptoms, she has been using ibuprofen and Tylenol as needed.  Patient is not drink enough water at home.  COVID test was negative at home, new test done here and pending.  Patient is found to have high blood pressure readings x 2.  She does not want to start blood pressure treatment at this time.  She will follow-up with her primary care provider as scheduled.  PLAN:  1) Common cold:  -Instructed to take Tylenol or ibuprofen as needed and stay hydrated.  -For new or worsening symptoms go to the nearest emergency room or call primary care provider office.  -COVID test done, results pending.   2) Elevated blood pressure reading:  -Patient offered blood pressure medication, declined.  -Patient reminded to make an appointment with her primary care provider tomorrow for blood pressure management.  -Reminded that high blood pressure can cause stroke and heart attack among other  conditions/complications.  Verbalized understanding.   Final Clinical Impressions(s) / UC Diagnoses   Final diagnoses:  Common cold  Viral upper respiratory tract infection  Elevated blood pressure reading     Discharge Instructions      - Drink enough fluids and have enough rest -COVID test result will not be available until after 24 hours -Take Tylenol or ibuprofen for symptoms -For new or worsening symptoms call your primary care provider and schedule an appointment or go to the emergency room -We offered treatment for high blood pressure readings, you elected to follow up with your primary care provider and endocrinologist. -Call your primary care provider tomorrow and schedule an appointment for further management of high blood pressure readings.      ED Prescriptions   None    PDMP not reviewed this encounter.   Eleonore Chiquito, FNP 05/20/23 1103

## 2023-08-08 ENCOUNTER — Encounter (HOSPITAL_COMMUNITY): Payer: Self-pay | Admitting: Emergency Medicine

## 2023-08-08 ENCOUNTER — Ambulatory Visit (HOSPITAL_COMMUNITY)
Admission: EM | Admit: 2023-08-08 | Discharge: 2023-08-08 | Disposition: A | Discharge status: Home / Self Care | Payer: 59 | Attending: Family Medicine | Admitting: Family Medicine

## 2023-08-08 DIAGNOSIS — L089 Local infection of the skin and subcutaneous tissue, unspecified: Secondary | ICD-10-CM

## 2023-08-08 DIAGNOSIS — B349 Viral infection, unspecified: Secondary | ICD-10-CM | POA: Diagnosis not present

## 2023-08-08 DIAGNOSIS — B9689 Other specified bacterial agents as the cause of diseases classified elsewhere: Secondary | ICD-10-CM | POA: Diagnosis not present

## 2023-08-08 DIAGNOSIS — Z1152 Encounter for screening for COVID-19: Secondary | ICD-10-CM | POA: Insufficient documentation

## 2023-08-08 MED ORDER — DOXYCYCLINE HYCLATE 100 MG PO CAPS: 100.0000 mg | ORAL_CAPSULE | Freq: Two times a day (BID) | ORAL | 0 refills | Status: DC

## 2023-08-08 NOTE — ED Triage Notes (Signed)
Pt c/o boil under right that she has had for a few months but Saturday she notice it was worse and painful.   She c/o has sinus pressure and a sore throat that started yesterday.

## 2023-08-09 LAB — SARS CORONAVIRUS 2 (TAT 6-24 HRS): SARS Coronavirus 2: NEGATIVE

## 2023-08-09 NOTE — ED Provider Notes (Signed)
[facility]   [id number] 08/08/23 Arrival Time: 1721  ASSESSMENT & PLAN:  1. Viral illness   2. Localized bacterial skin infection    No indication for I&D today of R axilla. Likely viral illness causing nasal congestion. Discussed typical duration of likely viral illness.   Discharge Medication List as of 08/08/2023  6:55 PM     START taking these medications   Details  doxycycline (VIBRAMYCIN) 100 MG capsule Take 1 capsule (100 mg total) by mouth 2 (two) times daily., Starting Wed 08/08/2023, Normal         Follow-up Information     Georgianne Fick, MD.   Specialty: Internal Medicine Why: As needed. Contact information: [address] [place] [address] [phone]         [facility].   Specialty: Urgent Care Why: If worsening or failing to improve as anticipated. Contact information: [address] [place] [address] [phone]                Reviewed expectations re: course of current medical issues. Questions answered. Outlined signs and symptoms indicating need for more acute intervention. Understanding verbalized. After Visit Summary given.   SUBJECTIVE: History from: Patient. [name] is a 42 y.o. female. Pt c/o boil under right that she has had for a few months but Saturday she notice it was worse and painful.   She c/o has sinus pressure and a sore throat that started yesterday. Denies: fever and difficulty breathing. Normal PO intake without n/v/d.  OBJECTIVE:  Vitals:   08/08/23 1813  BP: (!) 146/91  Pulse: 84  Resp: 17  Temp: 98.3 F (36.8 C)  TempSrc: Oral  SpO2: 99%    General appearance: alert; no distress Eyes: PERRLA; EOMI; conjunctiva normal HENT: [place]; AT; with mild nasal congestion Neck: supple  Lungs: speaks full sentences without difficulty; unlabored Extremities: no edema Skin: warm and dry; sub-cm area of mild  erythema and skin thickening in R axilla; no areas of fluctuance; without drainage or bleeding Neurologic: normal gait Psychological: alert and cooperative; normal mood and affect  Labs: Results for orders placed or performed during the hospital encounter of 08/08/23  SARS CORONAVIRUS 2 (TAT 6-24 HRS) Anterior Nasal Swab   Specimen: Anterior Nasal Swab  Result Value Ref Range   SARS Coronavirus 2 NEGATIVE NEGATIVE   Labs Reviewed  SARS CORONAVIRUS 2 (TAT 6-24 HRS)    Imaging: No results found.  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   Migraines    "last one was years ago" (05/20/2014)   Thyroid disease    Social History   Socioeconomic History   Marital status: Single    Spouse name: Not on file   Number of children: Not on file   Years of education: Not on file   Highest education level: Not on file  Occupational History   Not on file  Tobacco Use   Smoking status: Never   Smokeless tobacco: Never  Vaping Use   Vaping status: Never Used  Substance and Sexual Activity   Alcohol use: Yes    Comment: social   Drug use: No   Sexual activity: Not on file  Other Topics Concern   Not on file  Social  History Narrative   Not on file   Social Determinants of Health   Financial Resource Strain: Not on file  Food Insecurity: Not on file  Transportation Needs: Not on file  Physical Activity: Not on file  Stress: Not on file  Social Connections: Not on file  Intimate Partner Violence: Not on file   Family History  Problem Relation Age of Onset   Lupus Mother    Diabetes Father    Past Surgical History:  Procedure Laterality Date   CHOLECYSTECTOMY N/A 05/20/2014   Procedure: LAPAROSCOPIC CHOLECYSTECTOMY WITH INTRAOPERATIVE CHOLANGIOGRAM;  Surgeon: Atilano Ina, MD;  Location: [facility] OR;  Service:  General;  Laterality: N/A;   KNEE SURGERY     LAPAROSCOPIC CHOLECYSTECTOMY  05/20/2014   MYOMECTOMY  04/15/2013     Mardella Layman, MD 08/09/23 1113

## 2023-12-11 ENCOUNTER — Ambulatory Visit (HOSPITAL_COMMUNITY)
Admission: EM | Admit: 2023-12-11 | Discharge: 2023-12-11 | Disposition: A | Discharge status: Home / Self Care | Payer: 59 | Attending: Family Medicine | Admitting: Family Medicine

## 2023-12-11 ENCOUNTER — Encounter (HOSPITAL_COMMUNITY): Payer: Self-pay | Admitting: *Deleted

## 2023-12-11 DIAGNOSIS — J069 Acute upper respiratory infection, unspecified: Secondary | ICD-10-CM | POA: Diagnosis not present

## 2023-12-11 LAB — POC COVID19/FLU A&B COMBO
Covid Antigen, POC: NEGATIVE
Influenza A Antigen, POC: NEGATIVE
Influenza B Antigen, POC: NEGATIVE

## 2023-12-11 MED ORDER — BENZONATATE 200 MG PO CAPS: 200.0000 mg | ORAL_CAPSULE | Freq: Three times a day (TID) | ORAL | 0 refills | Status: AC | PRN

## 2023-12-11 NOTE — ED Provider Notes (Signed)
 [facility]    CSN: [id number] Arrival date & time: 12/11/23  0803      History   Chief Complaint Chief Complaint  Patient presents with   Cough   Generalized Body Aches   Nasal Congestion   Chills    HPI [name] is a 43 y.o. female.    Cough Associated symptoms: rhinorrhea    Patient is here for URI symptoms x 3 days.  Having a cough, chills, body aches.  No known fevers.  No wheezing or sob.  Some sinus congestion, drainage.  Using otc medications and cough syrup.        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   Migraines    "last one was years ago" (05/20/2014)   Thyroid disease     Patient Active Problem List   Diagnosis Date Noted   Acute on chronic cholecystitis 05/20/2014   Dysuria 05/20/2014   Hypothyroidism (acquired) 05/20/2014   Bilateral low back pain without sciatica 05/14/2014    Past Surgical History:  Procedure Laterality Date   CHOLECYSTECTOMY N/A 05/20/2014   Procedure: LAPAROSCOPIC CHOLECYSTECTOMY WITH INTRAOPERATIVE CHOLANGIOGRAM;  Surgeon: Atilano Ina, MD;  Location: [facility] OR;  Service: General;  Laterality: N/A;   KNEE SURGERY     LAPAROSCOPIC CHOLECYSTECTOMY  05/20/2014   MYOMECTOMY  04/15/2013    OB History   No obstetric history on file.      Home Medications    Prior to Admission medications   Medication Sig Start Date End Date Taking? Authorizing Provider  brompheniramine-pseudoephedrine-DM 30-2-10 MG/5ML syrup TAKE 5 ML BY MOUTH THREE TIMES DAILY FOR 14 DAYS AS NEEDED FOR COUGH 10/15/23  Yes [provider]  levothyroxine (SYNTHROID) 175 MCG tablet Take 175 mcg by mouth daily. 03/26/20  Yes [provider]  diclofenac (VOLTAREN) 75 MG EC tablet Take 1 tablet (75 mg total) by mouth 2 (two) times daily. 01/03/23   Lenn Sink, DPM   doxycycline (VIBRAMYCIN) 100 MG capsule Take 1 capsule (100 mg total) by mouth 2 (two) times daily. 08/08/23   Mardella Layman, MD  Fe Fum-FePoly-Vit C-Vit B3 (INTEGRA) 62.5-62.5-40-3 MG CAPS Take 1 capsule by mouth every evening.  03/29/20   [provider]  ferrous sulfate 325 (65 FE) MG tablet Take 1 tablet (325 mg total) by mouth daily. 05/24/20   Janace Aris, FNP  naproxen (NAPROSYN) 500 MG tablet Take 1 tablet (500 mg total) by mouth 2 (two) times daily with a meal. 06/27/20   Wallis Bamberg, PA-C  naproxen sodium (ALEVE) 220 MG tablet Take 220 mg by mouth.    [provider]  norethindrone (AYGESTIN) 5 MG tablet Take 2 tablets (10 mg total) by mouth daily. 05/02/21   Hyman Hopes, Margaux, PA-C  ondansetron (ZOFRAN ODT) 4 MG disintegrating tablet Take 1 tablet (4 mg total) by mouth every 8 (eight) hours as needed for nausea or vomiting. 05/24/20   Bast, Traci A, FNP  YASMIN 28 3-0.03 MG tablet Take 1 tablet by mouth daily. 03/26/20   [provider]  fluticasone (FLONASE) 50 MCG/ACT nasal spray Place 2 sprays into both nostrils daily. Patient not taking: Reported on 03/27/2020 10/06/18 03/27/20  Belinda Fisher, PA-C  ipratropium (ATROVENT) 0.06 % nasal spray Place 2 sprays into both nostrils 4 (four) times daily.  Patient not taking: Reported on 03/27/2020 10/06/18 03/27/20  Lurline Idol    Family History Family History  Problem Relation Age of Onset   Lupus Mother    Diabetes Father     Social History Social History   Tobacco Use   Smoking status: Never   Smokeless tobacco: Never  Vaping Use   Vaping status: Never Used  Substance Use Topics   Alcohol use: Yes    Comment: social   Drug use: No     Allergies   Patient has no known allergies.   Review of Systems Review of Systems  Constitutional:  Positive for fatigue.  HENT:  Positive for congestion and rhinorrhea.   Respiratory:  Positive for cough.   Gastrointestinal: Negative.   Genitourinary: Negative.    Musculoskeletal: Negative.   Psychiatric/Behavioral: Negative.       Physical Exam Triage Vital Signs ED Triage Vitals  Encounter Vitals Group     BP 12/11/23 0824 124/77     Systolic BP Percentile --      Diastolic BP Percentile --      Pulse Rate 12/11/23 0824 (!) 106     Resp 12/11/23 0824 18     Temp 12/11/23 0824 99.6 F (37.6 C)     Temp Source 12/11/23 0824 Oral     SpO2 12/11/23 0824 98 %     Weight --      Height --      Head Circumference --      Peak Flow --      Pain Score 12/11/23 0822 0     Pain Loc --      Pain Education --      Exclude from Growth Chart --    No data found.  Updated Vital Signs BP 124/77 (BP Location: Left Arm)   Pulse (!) 106   Temp 99.6 F (37.6 C) (Oral)   Resp 18   LMP 12/01/2023 (Approximate)   SpO2 98%   Visual Acuity Right Eye Distance:   Left Eye Distance:   Bilateral Distance:    Right Eye Near:   Left Eye Near:    Bilateral Near:     Physical Exam Constitutional:      General: She is not in acute distress.    Appearance: Normal appearance. She is normal weight. She is not ill-appearing or toxic-appearing.  HENT:     Nose: Congestion present. No rhinorrhea.     Mouth/Throat:     Mouth: Mucous membranes are moist.     Pharynx: Posterior oropharyngeal erythema present.  Cardiovascular:     Rate and Rhythm: Normal rate and regular rhythm.  Pulmonary:     Effort: Pulmonary effort is normal.     Breath sounds: Normal breath sounds. No wheezing or rhonchi.  Musculoskeletal:     Cervical back: Normal range of motion and neck supple. No tenderness.  Lymphadenopathy:     Cervical: No cervical adenopathy.  Skin:    General: Skin is warm.  Neurological:     General: No focal deficit present.     Mental Status: She is alert.  Psychiatric:        Mood and Affect: Mood normal.      UC Treatments / Results  Labs (all labs ordered are listed, but only abnormal results are displayed) Labs Reviewed  POC  COVID19/FLU A&B COMBO    EKG   Radiology No results found.  Procedures Procedures (including critical care time)  Medications Ordered in  UC Medications - No data to display  Initial Impression / Assessment and Plan / UC Course  I have reviewed the triage vital signs and the nursing notes.  Pertinent labs & imaging results that were available during my care of the patient were reviewed by me and considered in my medical decision making (see chart for details).    Final Clinical Impressions(s) / UC Diagnoses   Final diagnoses:  Viral URI with cough     Discharge Instructions      You were seen today for upper respiratory symptoms.  Your flu/covid swab was negative.  Your symptoms appear viral.  I have sent out a medication to help with the cough.  I recommend you get plenty of rest and increase fluids.  You may use tylenol/motrin for any pain/fevers.  Return if not improving as expected.     ED Prescriptions     Medication Sig Dispense Auth. Provider   benzonatate (TESSALON) 200 MG capsule Take 1 capsule (200 mg total) by mouth 3 (three) times daily as needed for cough. 21 capsule Jannifer Franklin, MD      PDMP not reviewed this encounter.   Jannifer Franklin, MD 12/11/23 [phone]

## 2023-12-11 NOTE — Discharge Instructions (Addendum)
 You were seen today for upper respiratory symptoms.  Your flu/covid swab was negative.  Your symptoms appear viral.  I have sent out a medication to help with the cough.  I recommend you get plenty of rest and increase fluids.  You may use tylenol/motrin for any pain/fevers.  Return if not improving as expected.

## 2023-12-11 NOTE — ED Triage Notes (Addendum)
 Pt states that on Sunday she started with cough, chills, body aches, she is taking zyrtec and RX cough meds.   Pt took a COVID test at home, it was neg

## 2023-12-29 ENCOUNTER — Emergency Department (HOSPITAL_BASED_OUTPATIENT_CLINIC_OR_DEPARTMENT_OTHER)
Admission: EM | Admit: 2023-12-29 | Discharge: 2023-12-29 | Disposition: A | Discharge status: Home / Self Care | Attending: Emergency Medicine | Admitting: Emergency Medicine

## 2023-12-29 ENCOUNTER — Emergency Department (HOSPITAL_BASED_OUTPATIENT_CLINIC_OR_DEPARTMENT_OTHER)

## 2023-12-29 ENCOUNTER — Encounter (HOSPITAL_BASED_OUTPATIENT_CLINIC_OR_DEPARTMENT_OTHER): Payer: Self-pay

## 2023-12-29 ENCOUNTER — Other Ambulatory Visit: Payer: Self-pay

## 2023-12-29 DIAGNOSIS — E876 Hypokalemia: Secondary | ICD-10-CM

## 2023-12-29 DIAGNOSIS — M79661 Pain in right lower leg: Secondary | ICD-10-CM | POA: Insufficient documentation

## 2023-12-29 LAB — CBC WITH DIFFERENTIAL/PLATELET
Abs Immature Granulocytes: 0.02 10*3/uL (ref 0.00–0.07)
Basophils Absolute: 0 10*3/uL (ref 0.0–0.1)
Basophils Relative: 0 %
Eosinophils Absolute: 0.1 10*3/uL (ref 0.0–0.5)
Eosinophils Relative: 1 %
HCT: 28.4 % — ABNORMAL LOW (ref 36.0–46.0)
Hemoglobin: 8.8 g/dL — ABNORMAL LOW (ref 12.0–15.0)
Immature Granulocytes: 0 %
Lymphocytes Relative: 30 %
Lymphs Abs: 2 10*3/uL (ref 0.7–4.0)
MCH: 21.1 pg — ABNORMAL LOW (ref 26.0–34.0)
MCHC: 31 g/dL (ref 30.0–36.0)
MCV: 67.9 fL — ABNORMAL LOW (ref 80.0–100.0)
Monocytes Absolute: 0.4 10*3/uL (ref 0.1–1.0)
Monocytes Relative: 6 %
Neutro Abs: 4.2 10*3/uL (ref 1.7–7.7)
Neutrophils Relative %: 63 %
Platelets: 327 10*3/uL (ref 150–400)
RBC: 4.18 MIL/uL (ref 3.87–5.11)
RDW: 20.1 % — ABNORMAL HIGH (ref 11.5–15.5)
WBC: 6.7 10*3/uL (ref 4.0–10.5)
nRBC: 0 % (ref 0.0–0.2)

## 2023-12-29 LAB — BASIC METABOLIC PANEL
Anion gap: 7 (ref 5–15)
BUN: 10 mg/dL (ref 6–20)
CO2: 23 mmol/L (ref 22–32)
Calcium: 9.2 mg/dL (ref 8.9–10.3)
Chloride: 104 mmol/L (ref 98–111)
Creatinine, Ser: 0.74 mg/dL (ref 0.44–1.00)
GFR, Estimated: >60 mL/min (ref >60)
Glucose, Bld: 105 mg/dL — ABNORMAL HIGH (ref 70–99)
Potassium: 3.4 mmol/L — ABNORMAL LOW (ref 3.5–5.1)
Sodium: 134 mmol/L — ABNORMAL LOW (ref 135–145)

## 2023-12-29 LAB — D-DIMER, QUANTITATIVE: D-Dimer, Quant: 1.35 ug{FEU}/mL — ABNORMAL HIGH (ref 0.00–0.50)

## 2023-12-29 NOTE — Discharge Instructions (Addendum)
 Your ultrasound did not show signs of blood clot in your leg.  Please follow-up with your doctor for recheck if continue having symptoms.  Your potassium level was very minorly low at 3.4.  We recommended adding more potassium to your diet.

## 2023-12-29 NOTE — ED Provider Notes (Signed)
 [place] EMERGENCY DEPARTMENT AT [facility] Provider Note   CSN: [id number] Arrival date & time: 12/29/23  4098     History  Chief Complaint  Patient presents with   Leg Pain    Lenor Provencher is a 43 y.o. female.  Presents to the emergency department for evaluation of right calf pain.  Patient reports that she first noticed the pain 5 days ago.  It seemed to be gone yesterday during the day but then when she woke up this morning she felt the pain in her calf when she got up from bed to go to the bathroom.  She reports that it feels sore like after having a charley horse.       Home Medications Prior to Admission medications   Medication Sig Start Date End Date Taking? Authorizing Provider  benzonatate (TESSALON) 200 MG capsule Take 1 capsule (200 mg total) by mouth 3 (three) times daily as needed for cough. 12/11/23   Piontek, Denny Peon, MD  brompheniramine-pseudoephedrine-DM 30-2-10 MG/5ML syrup TAKE 5 ML BY MOUTH THREE TIMES DAILY FOR 14 DAYS AS NEEDED FOR COUGH 10/15/23   [provider]  diclofenac (VOLTAREN) 75 MG EC tablet Take 1 tablet (75 mg total) by mouth 2 (two) times daily. 01/03/23   Lenn Sink, DPM  levothyroxine (SYNTHROID) 175 MCG tablet Take 175 mcg by mouth daily. 03/26/20   [provider]  fluticasone (FLONASE) 50 MCG/ACT nasal spray Place 2 sprays into both nostrils daily. Patient not taking: Reported on 03/27/2020 10/06/18 03/27/20  Belinda Fisher, PA-C  ipratropium (ATROVENT) 0.06 % nasal spray Place 2 sprays into both nostrils 4 (four) times daily. Patient not taking: Reported on 03/27/2020 10/06/18 03/27/20  Belinda Fisher, PA-C      Allergies    Patient has no known allergies.    Review of Systems   Review of Systems  Physical Exam Updated Vital Signs BP (!) 141/89   Pulse (!) 103   Temp 98.4 F (36.9 C) (Oral)   Resp 18   Ht 5\' 7"  (1.702 m)   Wt 126.6 kg   LMP 12/01/2023 (Approximate)   SpO2 100%   BMI 43.70 kg/m   Physical Exam Vitals and nursing note reviewed.  Constitutional:      General: She is not in acute distress.    Appearance: She is well-developed.  HENT:     Head: Normocephalic and atraumatic.     Mouth/Throat:     Mouth: Mucous membranes are moist.  Eyes:     General: Vision grossly intact. Gaze aligned appropriately.     Extraocular Movements: Extraocular movements intact.     Conjunctiva/sclera: Conjunctivae normal.  Cardiovascular:     Rate and Rhythm: Normal rate and regular rhythm.     Pulses:          Dorsalis pedis pulses are 1+ on the right side.     Heart sounds: Normal heart sounds, S1 normal and S2 normal. No murmur heard.    No friction rub. No gallop.  Pulmonary:     Effort: Pulmonary effort is normal. No respiratory distress.     Breath sounds: Normal breath sounds.  Abdominal:     General: Bowel sounds are normal.     Palpations: Abdomen is soft.     Tenderness: There is no abdominal tenderness. There is no guarding or rebound.     Hernia: No hernia is present.  Musculoskeletal:        General: No swelling.  Cervical back: Full passive range of motion without pain, normal range of motion and neck supple. No spinous process tenderness or muscular tenderness. Normal range of motion.     Right lower leg: Tenderness present. No swelling. No edema.     Left lower leg: No edema.  Skin:    General: Skin is warm and dry.     Capillary Refill: Capillary refill takes less than 2 seconds.     Findings: No ecchymosis, erythema, rash or wound.  Neurological:     General: No focal deficit present.     Mental Status: She is alert and oriented to person, place, and time.     GCS: GCS eye subscore is 4. GCS verbal subscore is 5. GCS motor subscore is 6.     Cranial Nerves: Cranial nerves 2-12 are intact.     Sensory: Sensation is intact.     Motor: Motor function is intact.     Coordination: Coordination is intact.  Psychiatric:        Attention and Perception:  Attention normal.        Mood and Affect: Mood normal.        Speech: Speech normal.        Behavior: Behavior normal.     ED Results / Procedures / Treatments   Labs (all labs ordered are listed, but only abnormal results are displayed) Labs Reviewed  CBC WITH DIFFERENTIAL/PLATELET - Abnormal; Notable for the following components:      Result Value   Hemoglobin 8.8 (*)    HCT 28.4 (*)    MCV 67.9 (*)    MCH 21.1 (*)    RDW 20.1 (*)    All other components within normal limits  BASIC METABOLIC PANEL - Abnormal; Notable for the following components:   Sodium 134 (*)    Potassium 3.4 (*)    Glucose, Bld 105 (*)    All other components within normal limits  D-DIMER, QUANTITATIVE - Abnormal; Notable for the following components:   D-Dimer, Quant 1.35 (*)    All other components within normal limits    EKG None  Radiology No results found.  Procedures Procedures    Medications Ordered in ED Medications - No data to display  ED Course/ Medical Decision Making/ A&P                                 Medical Decision Making Amount and/or Complexity of Data Reviewed Labs: ordered.   Patient with nontraumatic pain in her right calf.  She reports that her sister did have a DVT but it seems that that was related to oral contraceptives.  Patient without any recent long distance travel, surgery, etc.  Examination reveals mild tenderness of the calf without significant swelling, no venous cords.  Normal distal pulses.  Coloration is normal, no signs of infection.  Patient did have an elevated D-dimer.  Will perform venous evaluation to rule out DVT.  Will sign out to oncoming ER physician to follow results.        Final Clinical Impression(s) / ED Diagnoses Final diagnoses:  Right calf pain    Rx / DC Orders ED Discharge Orders     None         Alois Colgan, Canary Brim, MD 12/29/23 [phone]

## 2023-12-29 NOTE — ED Provider Notes (Signed)
 43 yo female presenting with calf pain, awaiting DVT ultrasound, positive Ddimer   Physical Exam  BP (!) 141/89   Pulse (!) 103   Temp 98.4 F (36.9 C) (Oral)   Resp 18   Ht 5\' 7"  (1.702 m)   Wt 126.6 kg   LMP 12/01/2023 (Approximate)   SpO2 100%   BMI 43.70 kg/m   Physical Exam  Procedures  Procedures  ED Course / MDM    Medical Decision Making Amount and/or Complexity of Data Reviewed Labs: ordered.   Ultrasound was negative for DVT.  Patient reassessed and had not reported chest pain or difficulty breathing.  Low suspicion for acute PE at this time.  Her heart rate is noted to have been elevated on prior visits to the ED over 100-110 bpm, likely physiological and chronic for age. patient can follow-up with her PCP.  She has noted to have chronic stable anemia on her labs.  Potassium is also very mildly low at 3.4, for which encouraged her to increase the potassium content of her foods.       Terald Sleeper, MD 12/29/23 [phone]

## 2023-12-29 NOTE — ED Triage Notes (Signed)
 Pt POV d/t right calf pain since Monday.  It has been intermittent "feels like a cramp".  Pt did have URI a couple of weeks ago but denies SOB.

## 2024-04-13 ENCOUNTER — Encounter (HOSPITAL_BASED_OUTPATIENT_CLINIC_OR_DEPARTMENT_OTHER): Payer: Self-pay | Admitting: Emergency Medicine

## 2024-04-13 ENCOUNTER — Emergency Department (HOSPITAL_BASED_OUTPATIENT_CLINIC_OR_DEPARTMENT_OTHER)
Admission: EM | Admit: 2024-04-13 | Discharge: 2024-04-13 | Disposition: A | Discharge status: Home / Self Care | Attending: Emergency Medicine | Admitting: Emergency Medicine

## 2024-04-13 DIAGNOSIS — M542 Cervicalgia: Secondary | ICD-10-CM | POA: Diagnosis present

## 2024-04-13 LAB — URINALYSIS, W/ REFLEX TO CULTURE (INFECTION SUSPECTED)
Bacteria, UA: NONE SEEN
Bilirubin Urine: NEGATIVE
Glucose, UA: NEGATIVE mg/dL
Hgb urine dipstick: NEGATIVE
Ketones, ur: NEGATIVE mg/dL
Leukocytes,Ua: NEGATIVE
Nitrite: NEGATIVE
Protein, ur: NEGATIVE mg/dL
Specific Gravity, Urine: 1.009 (ref 1.005–1.030)
pH: 6.5 (ref 5.0–8.0)

## 2024-04-13 LAB — PREGNANCY, URINE: Preg Test, Ur: NEGATIVE

## 2024-04-13 MED ORDER — MELOXICAM 7.5 MG PO TABS: 7.5000 mg | ORAL_TABLET | Freq: Every day | ORAL | 0 refills | Status: AC

## 2024-04-13 NOTE — Discharge Instructions (Addendum)
 Follow-up with your orthopedist on July 15 as scheduled.  Return to the emergency room if you have any worsening symptoms.
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
## 2024-04-13 NOTE — ED Notes (Signed)
 Dc instructions reviewed with patient. Patient voiced understanding. Dc with belongings.

## 2024-04-13 NOTE — ED Triage Notes (Signed)
 Neck pain ( felt like I slept wrong) goes down into shoulders Started earlier this month. [facility] by [facility] Xrays and prednisone  and methocarb Still hurting   Tylenol  helps some with pain

## 2024-04-14 ENCOUNTER — Other Ambulatory Visit: Payer: Self-pay

## 2024-04-14 ENCOUNTER — Ambulatory Visit (HOSPITAL_COMMUNITY)
Admission: EM | Admit: 2024-04-14 | Discharge: 2024-04-14 | Disposition: A | Discharge status: Home / Self Care | Attending: Emergency Medicine | Admitting: Emergency Medicine

## 2024-04-14 ENCOUNTER — Encounter (HOSPITAL_COMMUNITY): Payer: Self-pay | Admitting: *Deleted

## 2024-04-14 DIAGNOSIS — N898 Other specified noninflammatory disorders of vagina: Secondary | ICD-10-CM | POA: Diagnosis present

## 2024-04-14 DIAGNOSIS — Z113 Encounter for screening for infections with a predominantly sexual mode of transmission: Secondary | ICD-10-CM | POA: Diagnosis present

## 2024-04-14 DIAGNOSIS — M545 Low back pain, unspecified: Secondary | ICD-10-CM | POA: Insufficient documentation

## 2024-04-14 LAB — HIV ANTIBODY (ROUTINE TESTING W REFLEX): HIV Screen 4th Generation wRfx: NONREACTIVE

## 2024-04-14 NOTE — ED Triage Notes (Signed)
 C/O transient clearish-pink vaginal discharge onset approx 1 wk ago. Also reports having had polyuria lasting approx 2 days last wk, which has resolved. Yesterday and today has had some low back cramping. Pt was seen in ED yesterday for unrelated problem, and states her urine was clear. Denies fevers; denies n/v.

## 2024-04-14 NOTE — Discharge Instructions (Addendum)
 Your results will come back over the next few days and someone will call if results are positive and require treatment.    Follow-up with your OB/GYN if you have recurring symptoms related to this.  Return here as needed.

## 2024-04-14 NOTE — ED Provider Notes (Signed)
 [facility]    CSN: [id number] Arrival date & time: 04/14/24  1753      History   Chief Complaint Chief Complaint  Patient presents with   Vaginal Discharge   Back Pain    HPI [name] is a 43 y.o. female.   Patient presents with clearish pink vaginal discharge x 1 week.  Patient states that she did have 2 days of polyuria that occurred last week.  Patient states that she does not continue to have polyuria.  Patient also endorses some intermittent left-sided low back cramping that began yesterday.  Denies dysuria, urinary frequency/urgency, hematuria, vaginal pain, vaginal lesions, abdominal pain, and fever.  Patient reports that she is sexually active.  Patient denies any known exposures to STDs.  Patient states LMP 03/28/2024.  Patient does report a history of uterine fibroids and occasionally has discharge consistent with what she is currently having related to this.  Patient states that she was seen in the ER yesterday for an unrelated issue and they did check her urine at that time and there was no evidence of a urinary tract infection at that time.  Patient also states that they performed a pregnancy test which was negative.  Confirmed this upon chart review.  The history is provided by the patient and medical records.  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   Migraines    last one was years ago (05/20/2014)   Thyroid  disease     Patient Active Problem List   Diagnosis Date Noted   Acute on chronic cholecystitis 05/20/2014   Dysuria 05/20/2014   Hypothyroidism (acquired) 05/20/2014   Bilateral low back pain without sciatica 05/14/2014    Past Surgical History:  Procedure Laterality Date   CHOLECYSTECTOMY N/A 05/20/2014   Procedure: LAPAROSCOPIC CHOLECYSTECTOMY WITH  INTRAOPERATIVE CHOLANGIOGRAM;  Surgeon: Camellia CHRISTELLA Blush, MD;  Location: [facility] OR;  Service: General;  Laterality: N/A;   KNEE SURGERY     LAPAROSCOPIC CHOLECYSTECTOMY  05/20/2014   MYOMECTOMY  04/15/2013    OB History   No obstetric history on file.      Home Medications    Prior to Admission medications   Medication Sig Start Date End Date Taking? Authorizing Provider  levothyroxine  (SYNTHROID ) 175 MCG tablet Take 175 mcg by mouth daily. 03/26/20  Yes [provider]  meloxicam (MOBIC) 7.5 MG tablet Take 1 tablet (7.5 mg total) by mouth daily. 04/13/24  Yes Lenor Hollering, MD  methocarbamol (ROBAXIN) 500 MG tablet Take 1 tablet twice a day by oral route. 04/05/24  Yes [provider]  benzonatate  (TESSALON ) 200 MG capsule Take 1 capsule (200 mg total) by mouth 3 (three) times daily as needed for cough. 12/11/23   Piontek, Rocky, MD  brompheniramine-pseudoephedrine-DM 30-2-10 MG/5ML syrup TAKE 5 ML BY MOUTH THREE TIMES DAILY FOR 14 DAYS AS NEEDED FOR COUGH 10/15/23   [provider]  fluticasone  (FLONASE ) 50 MCG/ACT nasal spray Place 2 sprays into both nostrils daily. Patient not taking: Reported on 03/27/2020 10/06/18 03/27/20  Babara Greig GAILS, PA-C  ipratropium (ATROVENT ) 0.06 % nasal spray Place 2 sprays into both nostrils 4 (four) times daily. Patient not taking: Reported on 03/27/2020 10/06/18 03/27/20  Babara Greig GAILS DEVONNA    Family History Family History  Problem  Relation Age of Onset   Lupus Mother    Diabetes Father     Social History Social History   Tobacco Use   Smoking status: Never   Smokeless tobacco: Never  Vaping Use   Vaping status: Never Used  Substance Use Topics   Alcohol use: Yes    Comment: social   Drug use: No     Allergies   Patient has no known allergies.   Review of Systems Review of Systems  Genitourinary:  Positive for vaginal discharge.  Musculoskeletal:  Positive for back pain.   Per HPI  Physical Exam Triage Vital  Signs ED Triage Vitals  Encounter Vitals Group     BP 04/14/24 1916 127/80     Girls Systolic BP Percentile --      Girls Diastolic BP Percentile --      Boys Systolic BP Percentile --      Boys Diastolic BP Percentile --      Pulse Rate 04/14/24 1916 81     Resp 04/14/24 1916 18     Temp 04/14/24 1916 97.9 F (36.6 C)     Temp Source 04/14/24 1916 Oral     SpO2 04/14/24 1916 100 %     Weight --      Height --      Head Circumference --      Peak Flow --      Pain Score 04/14/24 1918 2     Pain Loc --      Pain Education --      Exclude from Growth Chart --    No data found.  Updated Vital Signs BP 127/80   Pulse 81   Temp 97.9 F (36.6 C) (Oral)   Resp 18   LMP 03/28/2024 (Exact Date)   SpO2 100%   Visual Acuity Right Eye Distance:   Left Eye Distance:   Bilateral Distance:    Right Eye Near:   Left Eye Near:    Bilateral Near:     Physical Exam Vitals and nursing note reviewed.  Constitutional:      General: She is awake. She is not in acute distress.    Appearance: Normal appearance. She is well-developed and well-groomed. She is not ill-appearing.  Abdominal:     General: Abdomen is protuberant. Bowel sounds are normal. There is no distension.     Palpations: Abdomen is soft.     Tenderness: There is no abdominal tenderness. There is no right CVA tenderness, left CVA tenderness or guarding.  Genitourinary:    Comments: Exam deferred  Musculoskeletal:     Lumbar back: Normal. No tenderness or bony tenderness.   Skin:    General: Skin is warm and dry.   Neurological:     Mental Status: She is alert.   Psychiatric:        Behavior: Behavior is cooperative.      UC Treatments / Results  Labs (all labs ordered are listed, but only abnormal results are displayed) Labs Reviewed  HIV ANTIBODY (ROUTINE TESTING W REFLEX)  RPR  CERVICOVAGINAL ANCILLARY ONLY    EKG   Radiology No results found.  Procedures Procedures (including critical  care time)  Medications Ordered in UC Medications - No data to display  Initial Impression / Assessment and Plan / UC Course  I have reviewed the triage vital signs and the nursing notes.  Pertinent labs & imaging results that were available during my care of the patient were reviewed by me  and considered in my medical decision making (see chart for details).     Patient is overall well-appearing.  Vitals are stable.  No significant findings upon assessment.  GU exam deferred.  Patient perform self swab for STD/STI.  HIV and RPR ordered.  Deferred urinalysis and pregnancy testing due to this being performed yesterday and results were unremarkable.  Discussed follow-up and return precautions. Final Clinical Impressions(s) / UC Diagnoses   Final diagnoses:  Vaginal discharge  Intermittent low back pain  Screening for STD (sexually transmitted disease)     Discharge Instructions      Your results will come back over the next few days and someone will call if results are positive and require treatment.    Follow-up with your OB/GYN if you have recurring symptoms related to this.  Return here as needed.     ED Prescriptions   None    PDMP not reviewed this encounter.   Johnie Flaming A, NP 04/14/24 [phone]

## 2024-04-15 ENCOUNTER — Ambulatory Visit (HOSPITAL_COMMUNITY): Payer: Self-pay

## 2024-04-15 LAB — CERVICOVAGINAL ANCILLARY ONLY
Bacterial Vaginitis (gardnerella): POSITIVE — AB
Candida Glabrata: NEGATIVE
Candida Vaginitis: NEGATIVE
Chlamydia: NEGATIVE
Comment: NEGATIVE
Comment: NEGATIVE
Comment: NEGATIVE
Comment: NEGATIVE
Comment: NEGATIVE
Comment: NORMAL
Neisseria Gonorrhea: NEGATIVE
Trichomonas: NEGATIVE

## 2024-04-15 LAB — RPR: RPR Ser Ql: NONREACTIVE

## 2024-04-15 MED ORDER — METRONIDAZOLE 0.75 % VA GEL: 1.0000 | Freq: Every day | VAGINAL | 0 refills | Status: AC

## 2024-04-15 MED ORDER — METRONIDAZOLE 500 MG PO TABS: 500.0000 mg | ORAL_TABLET | Freq: Two times a day (BID) | ORAL | 0 refills | Status: DC

## 2024-04-15 NOTE — Addendum Note (Signed)
 Addended by: ARNALDO ALFONSO RAMAN on: 04/15/2024 03:57 PM   Modules accepted: Orders

## 2024-10-05 ENCOUNTER — Emergency Department (HOSPITAL_BASED_OUTPATIENT_CLINIC_OR_DEPARTMENT_OTHER)
Admission: EM | Admit: 2024-10-05 | Discharge: 2024-10-05 | Disposition: A | Discharge status: Home / Self Care | Attending: Emergency Medicine | Admitting: Emergency Medicine

## 2024-10-05 ENCOUNTER — Encounter (HOSPITAL_BASED_OUTPATIENT_CLINIC_OR_DEPARTMENT_OTHER): Payer: Self-pay

## 2024-10-05 ENCOUNTER — Emergency Department (HOSPITAL_BASED_OUTPATIENT_CLINIC_OR_DEPARTMENT_OTHER)

## 2024-10-05 ENCOUNTER — Other Ambulatory Visit: Payer: Self-pay

## 2024-10-05 DIAGNOSIS — N83202 Unspecified ovarian cyst, left side: Secondary | ICD-10-CM | POA: Diagnosis not present

## 2024-10-05 DIAGNOSIS — D259 Leiomyoma of uterus, unspecified: Secondary | ICD-10-CM | POA: Diagnosis not present

## 2024-10-05 DIAGNOSIS — R109 Unspecified abdominal pain: Secondary | ICD-10-CM | POA: Diagnosis present

## 2024-10-05 DIAGNOSIS — D219 Benign neoplasm of connective and other soft tissue, unspecified: Secondary | ICD-10-CM

## 2024-10-05 LAB — CBC WITH DIFFERENTIAL/PLATELET
Abs Immature Granulocytes: 0.02 K/uL (ref 0.00–0.07)
Basophils Absolute: 0 K/uL (ref 0.0–0.1)
Basophils Relative: 0 %
Eosinophils Absolute: 0 K/uL (ref 0.0–0.5)
Eosinophils Relative: 1 %
HCT: 26.7 % — ABNORMAL LOW (ref 36.0–46.0)
Hemoglobin: 7.9 g/dL — ABNORMAL LOW (ref 12.0–15.0)
Immature Granulocytes: 0 %
Lymphocytes Relative: 20 %
Lymphs Abs: 1.2 K/uL (ref 0.7–4.0)
MCH: 20.7 pg — ABNORMAL LOW (ref 26.0–34.0)
MCHC: 29.6 g/dL — ABNORMAL LOW (ref 30.0–36.0)
MCV: 69.9 fL — ABNORMAL LOW (ref 80.0–100.0)
Monocytes Absolute: 0.4 K/uL (ref 0.1–1.0)
Monocytes Relative: 6 %
Neutro Abs: 4.3 K/uL (ref 1.7–7.7)
Neutrophils Relative %: 73 %
Platelets: 314 K/uL (ref 150–400)
RBC: 3.82 MIL/uL — ABNORMAL LOW (ref 3.87–5.11)
RDW: 25.7 % — ABNORMAL HIGH (ref 11.5–15.5)
WBC: 5.9 K/uL (ref 4.0–10.5)
nRBC: 0 % (ref 0.0–0.2)

## 2024-10-05 LAB — COMPREHENSIVE METABOLIC PANEL WITH GFR
ALT: 19 U/L (ref 0–44)
AST: 20 U/L (ref 15–41)
Albumin: 4 g/dL (ref 3.5–5.0)
Alkaline Phosphatase: 73 U/L (ref 38–126)
Anion gap: 11 (ref 5–15)
BUN: 9 mg/dL (ref 6–20)
CO2: 24 mmol/L (ref 22–32)
Calcium: 9.9 mg/dL (ref 8.9–10.3)
Chloride: 100 mmol/L (ref 98–111)
Creatinine, Ser: 0.78 mg/dL (ref 0.44–1.00)
GFR, Estimated: >60 mL/min
Glucose, Bld: 98 mg/dL (ref 70–99)
Potassium: 3.8 mmol/L (ref 3.5–5.1)
Sodium: 136 mmol/L (ref 135–145)
Total Bilirubin: 0.4 mg/dL (ref 0.0–1.2)
Total Protein: 9 g/dL — ABNORMAL HIGH (ref 6.5–8.1)

## 2024-10-05 LAB — URINALYSIS, ROUTINE W REFLEX MICROSCOPIC
Bacteria, UA: NONE SEEN
Bilirubin Urine: NEGATIVE
Glucose, UA: NEGATIVE mg/dL
Hgb urine dipstick: NEGATIVE
Ketones, ur: NEGATIVE mg/dL
Leukocytes,Ua: NEGATIVE
Nitrite: NEGATIVE
Protein, ur: NEGATIVE mg/dL
Specific Gravity, Urine: 1.018 (ref 1.005–1.030)
pH: 6 (ref 5.0–8.0)

## 2024-10-05 LAB — PREGNANCY, URINE: Preg Test, Ur: NEGATIVE

## 2024-10-05 MED ORDER — KETOROLAC TROMETHAMINE 15 MG/ML IJ SOLN
15.0000 mg | Freq: Once | INTRAMUSCULAR | Status: AC
  Administered 2024-10-05: 15 mg via INTRAVENOUS
  Filled 2024-10-05: qty 1

## 2024-10-05 NOTE — ED Provider Notes (Signed)
 " [facility] AT [facility] Provider Note   CSN: [id number] Arrival date & time: 10/05/24  9270     Patient presents with: Abdominal Pain and Flank Pain (Left )   [name] is a 43 y.o. female.   Left flank pain for the last 2 days.  Nothing makes it worse or better.  Feels like could be related to gas.  History of fibroids.  Denies any obvious pain with urination but has had a kidney infection before in the past that felt similar.  Denies kidney stone history.  No fever chills nausea vomiting diarrhea.  The history is provided by the patient.       Prior to Admission medications  Medication Sig Start Date End Date Taking? Authorizing Provider  benzonatate  (TESSALON ) 200 MG capsule Take 1 capsule (200 mg total) by mouth 3 (three) times daily as needed for cough. 12/11/23   Piontek, Rocky, MD  brompheniramine-pseudoephedrine-DM 30-2-10 MG/5ML syrup TAKE 5 ML BY MOUTH THREE TIMES DAILY FOR 14 DAYS AS NEEDED FOR COUGH 10/15/23   [provider]  levothyroxine  (SYNTHROID ) 175 MCG tablet Take 175 mcg by mouth daily. 03/26/20   [provider]  meloxicam  (MOBIC ) 7.5 MG tablet Take 1 tablet (7.5 mg total) by mouth daily. 04/13/24   Lenor Hollering, MD  methocarbamol (ROBAXIN) 500 MG tablet Take 1 tablet twice a day by oral route. 04/05/24   [provider]  fluticasone  (FLONASE ) 50 MCG/ACT nasal spray Place 2 sprays into both nostrils daily. Patient not taking: Reported on 03/27/2020 10/06/18 03/27/20  Babara Greig GAILS, PA-C  ipratropium (ATROVENT ) 0.06 % nasal spray Place 2 sprays into both nostrils 4 (four) times daily. Patient not taking: Reported on 03/27/2020 10/06/18 03/27/20  Babara Greig GAILS, PA-C    Allergies: Patient has no known allergies.    Review of Systems  Updated Vital Signs BP (!) 146/77   Pulse 92   Temp 98.2 F (36.8 C) (Oral)   Resp 20   Ht 5' 7 (1.702 m)   Wt 122.9 kg   LMP 09/10/2024   SpO2 98%   BMI 42.44 kg/m    Physical Exam Vitals and nursing note reviewed.  Constitutional:      General: She is not in acute distress.    Appearance: She is well-developed. She is not ill-appearing.  HENT:     Head: Normocephalic and atraumatic.     Nose: Nose normal.     Mouth/Throat:     Mouth: Mucous membranes are moist.  Eyes:     Extraocular Movements: Extraocular movements intact.     Conjunctiva/sclera: Conjunctivae normal.     Pupils: Pupils are equal, round, and reactive to light.  Cardiovascular:     Rate and Rhythm: Normal rate and regular rhythm.     Pulses: Normal pulses.     Heart sounds: Normal heart sounds. No murmur heard. Pulmonary:     Effort: Pulmonary effort is normal. No respiratory distress.     Breath sounds: Normal breath sounds.  Abdominal:     General: Abdomen is flat.     Palpations: Abdomen is soft.     Tenderness: There is abdominal tenderness. There is left CVA tenderness.  Musculoskeletal:        General: No swelling.     Cervical back: Normal range of motion and neck supple.  Skin:    General: Skin is warm and dry.     Capillary Refill: Capillary refill takes less than 2 seconds.  Neurological:     General: No focal deficit present.     Mental Status: She is alert and oriented to person, place, and time.     Cranial Nerves: No cranial nerve deficit.     Sensory: No sensory deficit.     Motor: No weakness.     Comments: 5+ out of 5 strength, normal sensation  Psychiatric:        Mood and Affect: Mood normal.     (all labs ordered are listed, but only abnormal results are displayed) Labs Reviewed  CBC WITH DIFFERENTIAL/PLATELET - Abnormal; Notable for the following components:      Result Value   RBC 3.82 (*)    Hemoglobin 7.9 (*)    HCT 26.7 (*)    MCV 69.9 (*)    MCH 20.7 (*)    MCHC 29.6 (*)    RDW 25.7 (*)    All other components within normal limits  COMPREHENSIVE METABOLIC PANEL WITH GFR - Abnormal; Notable for the following components:   Total  Protein 9.0 (*)    All other components within normal limits  PREGNANCY, URINE  URINALYSIS, ROUTINE W REFLEX MICROSCOPIC    EKG: None  Radiology: US  PELVIC COMPLETE W TRANSVAGINAL AND TORSION R/O Result Date: 10/05/2024 EXAM: US  Pelvis, Complete Transvaginal and Transabdominal without Doppler TECHNIQUE: Transabdominal and transvaginal pelvic duplex ultrasound using B-mode/gray scaled imaging without Doppler spectral analysis and color flow was obtained. COMPARISON: None provided CLINICAL HISTORY: Pelvic pain. FINDINGS: UTERUS: Massively enlarged uterus again noted measuring 18.5 x 12.8 x 14.7 cm with a volume of 1819 ml. Multiple calcified uterine fibroids noted, the largest measures 8.6 x 3.8 x 7.7 cm. There is a noncalcified fibroid posteriorly, measuring 9.7 x 7.9 x 10.4 cm. ENDOMETRIAL STRIPE: The endometrium was not well visualized or evaluated due to the overlying fibroids. RIGHT OVARY: The right ovary was . not visualized due to overlying bowel gas. LEFT OVARY: Left ovarian cyst measuring 7.3 x 4.2 x 6.8 cm. The left ovary measures 9.5 x 5 x 6 cm with a volume of 151 ml. There is normal arterial and venous Doppler flow. FREE FLUID: No free fluid. IMPRESSION: 1. Massively enlarged uterus with multiple fibroids, including calcified fibroids and a dominant noncalcified posterior fibroid measuring 9.7 x 7.9 x 10.4 cm. Endometrium not well visualized due to overlying fibroids. 2. Left ovarian cyst measuring up to 7.3 cm. No sonographic findings of left ovarian torsion at this time. Recommend follow-up ultrasound in 3-6 months. 3. Right ovary not visualized due to overlying bowel gas. Electronically signed by: Rogelia Myers MD 10/05/2024 10:19 AM EST RP Workstation: [id number]   CT Renal Stone Study Result Date: 10/05/2024 EXAM: CT UROGRAM 10/05/2024 08:29:45 AM TECHNIQUE: CT of the abdomen and pelvis was performed without the administration of intravenous contrast. Multiplanar reformatted images  as well as MIP urogram images are provided for review. Automated exposure control, iterative reconstruction, and/or weight based adjustment of the mA/kV was utilized to reduce the radiation dose to as low as reasonably achievable. COMPARISON: 04/28/2020 CLINICAL HISTORY: Abdominal/flank pain, stone suspected. FINDINGS: LOWER CHEST: No acute abnormality. LIVER: Low attenuation lesion within the right lateral dome of the liver is unchanged from 04/28/2020 and is favored to represent a benign hemangioma. No suspicious liver abnormality. GALLBLADDER AND BILE DUCTS: Status post cholecystectomy. No biliary ductal dilatation. SPLEEN: No acute abnormality. PANCREAS: No acute abnormality. ADRENAL GLANDS: No acute abnormality. KIDNEYS, URETERS AND BLADDER: No stones in the kidneys or ureters. No hydronephrosis. No  perinephric or periureteral stranding. Urinary bladder is unremarkable. GI AND BOWEL: Normal appearance of the stomach. No bowel dilatation. The appendix is visualized and appears within normal limits. PERITONEUM AND RETROPERITONEUM: No free fluid identified within the abdomen or pelvis. No signs of pneumoperitoneum. VASCULATURE: Aorta is normal in caliber. LYMPH NODES: No lymphadenopathy. REPRODUCTIVE ORGANS: Enlarged uterus containing rim calcified fibroids is again noted. The largest fibroid is in the left lower anterior uterine corpus measuring 6 cm in maximum dimension, image 66/2. The uterus measures 17.5 x 12.9 x 13.2 cm with a volume of 2900 cc. Since the previous exam new asymmetric low attenuation enlargement of the left adnexa where there is a new 8.3 x 4.6 x 10.0 cm low attenuation mass with mild surrounding soft tissue stranding. BONES AND SOFT TISSUES: No acute or suspicious osseous findings. No focal soft tissue abnormality. IMPRESSION: 1. New 10.0 cm left adnexal low-attenuation mass. There is mild surrounding soft tissue stranding. Etiology indeterminate. This may represent an enlarged ovary in the  setting of ovarian torsion. Underlying ovarian mass is also a potential differential consideration. Recommend prompt pelvic ultrasound with Doppler for further evaluation. 2. No signs of urinary tract calculi 3. Enlarged fibroid uterus with several rim-calcified degenerative fibroids. Electronically signed by: Waddell Calk MD 10/05/2024 08:40 AM EST RP Workstation: [id number]     Procedures   Medications Ordered in the ED - No data to display                                  Medical Decision Making Amount and/or Complexity of Data Reviewed Labs: ordered. Radiology: ordered.   Viney Casebier is here with left flank pain.  Normal vitals.  No fever.  Differential diagnosis kidney stone versus UTI versus musculoskeletal process versus constipation versus referred fibroid pain.  I do not think there is any cardiac or pulmonary process.  Will pursue abdominal workup with CBC CMP urinalysis CT scan.  Lab work showed no significant leukocytosis anemia or electrolyte abnormality.  Urinalysis negative for infection CT scan showed no kidney stone but this shows some sort of mass in the left ovary that looked new.  She had an MRI done last month that showed a left cyst there.  I got an ultrasound which confirmed that this is a left ovarian cyst at the 7.3 cm but no ovarian torsion and clinically I have low suspicion for that as well.  Is not having any pain on the right side.  She does have large fibroids throughout.  She supposed to have surgery for this likely soon.  Overall she understands strict return precautions if she develops more severe pain and constant pain she should come back for reevaluation.  Otherwise follow-up with OB/GYN take Tylenol  and ibuprofen .  Discharged in good condition.  This chart was dictated using voice recognition software.  Despite best efforts to proofread,  errors can occur which can change the documentation meaning.      Final diagnoses:  Cyst of left ovary   Fibroid    ED Discharge Orders     None          Ruthe Cornet, DO 10/05/24 1028  "

## 2024-10-05 NOTE — Discharge Instructions (Signed)
 Continue Tylenol  and ibuprofen  for pain.

## 2024-10-05 NOTE — ED Triage Notes (Signed)
 Arrives ambulatory to the ED with complaints of left side back pain and abdominal discomfort x3 days.
